# Patient Record
Sex: Female | Born: 1965 | Race: Black or African American | Hispanic: No | Marital: Single | Smoking: Never smoker
Health system: Southern US, Community
[De-identification: ages and names within clinical notes are randomized; demographics above are authoritative.]

## PROBLEM LIST (undated history)

## (undated) DIAGNOSIS — R519 Headache, unspecified: Secondary | ICD-10-CM

## (undated) DIAGNOSIS — E785 Hyperlipidemia, unspecified: Secondary | ICD-10-CM

## (undated) DIAGNOSIS — K219 Gastro-esophageal reflux disease without esophagitis: Secondary | ICD-10-CM

## (undated) DIAGNOSIS — G709 Myoneural disorder, unspecified: Secondary | ICD-10-CM

## (undated) DIAGNOSIS — I10 Essential (primary) hypertension: Secondary | ICD-10-CM

## (undated) DIAGNOSIS — D259 Leiomyoma of uterus, unspecified: Secondary | ICD-10-CM

## (undated) DIAGNOSIS — J3089 Other allergic rhinitis: Secondary | ICD-10-CM

## (undated) DIAGNOSIS — U071 COVID-19: Secondary | ICD-10-CM

## (undated) HISTORY — PX: LEG SURGERY: SHX1003

## (undated) HISTORY — DX: Headache, unspecified: R51.9

## (undated) HISTORY — PX: BREAST CYST ASPIRATION: SHX578

---
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
## 2009-10-07 ENCOUNTER — Emergency Department (HOSPITAL_COMMUNITY): Admission: EM | Admit: 2009-10-07 | Discharge: 2009-10-07 | Payer: Self-pay | Admitting: Family Medicine

## 2012-03-27 ENCOUNTER — Encounter (HOSPITAL_COMMUNITY): Payer: Self-pay | Admitting: *Deleted

## 2012-03-27 ENCOUNTER — Emergency Department (INDEPENDENT_AMBULATORY_CARE_PROVIDER_SITE_OTHER)
Admission: EM | Admit: 2012-03-27 | Discharge: 2012-03-27 | Disposition: A | Discharge status: Home / Self Care | Payer: Managed Care, Other (non HMO) | Source: Home / Self Care | Attending: Family Medicine | Admitting: Family Medicine

## 2012-03-27 DIAGNOSIS — A084 Viral intestinal infection, unspecified: Secondary | ICD-10-CM

## 2012-03-27 DIAGNOSIS — A09 Infectious gastroenteritis and colitis, unspecified: Secondary | ICD-10-CM

## 2012-03-27 HISTORY — DX: Essential (primary) hypertension: I10

## 2012-03-27 NOTE — ED Provider Notes (Signed)
History     CSN: [id number]  Arrival date & time 03/27/12  2595   First MD Initiated Contact with Patient 03/27/12 1017      Chief Complaint  Patient presents with  . Vomiting  . Diarrhea    (Consider location/radiation/quality/duration/timing/severity/associated sxs/prior treatment) Patient is a 46 y.o. female presenting with vomiting. The history is provided by the patient.  Emesis  This is a new problem. The current episode started more than 2 days ago (protracted vomiting on tues, resolved then had diarrhea on wed, resolved and relapsed last eve.). The problem has been gradually improving. There has been no fever. Associated symptoms include diarrhea. Pertinent negatives include no fever.    Past Medical History  Diagnosis Date  . Hypertension     History reviewed. No pertinent past surgical history.  No family history on file.  History  Substance Use Topics  . Smoking status: Never Smoker   . Smokeless tobacco: Not on file  . Alcohol Use: No    OB History    Grav Para Term Preterm Abortions TAB SAB Ect Mult Living                  Review of Systems  Constitutional: Negative.  Negative for fever.  Gastrointestinal: Positive for vomiting and diarrhea. Negative for blood in stool.  Genitourinary: Negative.     Allergies  Penicillins  Home Medications   Current Outpatient Rx  Name Route Sig Dispense Refill  . ASPIRIN 81 MG PO TABS Oral Take 81 mg by mouth daily.    Marland Kitchen LISINOPRIL-HYDROCHLOROTHIAZIDE 20-12.5 MG PO TABS Oral Take 1 tablet by mouth daily.    Marland Kitchen ONE-DAILY MULTI VITAMINS PO TABS Oral Take 1 tablet by mouth daily.      BP 116/77  Pulse 83  Temp(Src) 98.4 F (36.9 C) (Oral)  Resp 14  SpO2 99%  LMP 03/24/2012  Physical Exam  Nursing note and vitals reviewed. Constitutional: She is oriented to person, place, and time. She appears well-developed and well-nourished.  HENT:  Head: Normocephalic.  Mouth/Throat: Oropharynx is clear and moist.   Cardiovascular: Normal rate and regular rhythm.   Pulmonary/Chest: Breath sounds normal.  Abdominal: Soft. Bowel sounds are normal. She exhibits no distension and no mass. There is no tenderness. There is no rebound and no guarding.  Neurological: She is alert and oriented to person, place, and time.  Skin: Skin is warm and dry.  Psychiatric: She has a normal mood and affect.    ED Course  Procedures (including critical care time)  Labs Reviewed - No data to display No results found.   1. Gastroenteritis and colitis, viral       MDM          Linna Hoff, MD 03/30/12 [phone]

## 2012-03-27 NOTE — ED Notes (Signed)
Pt states she started with vomiting and diarrhea on Tuesday.  Stayed home from work on Wednesday and just did liquids and took otc med for diarrhea.  States she felt better yesterday and went to work.  Last night the diarrhea started again and she feels weak.  Color good.

## 2012-03-27 NOTE — Discharge Instructions (Signed)
Clear liquid , bland diet today as tolerated, advance on sat as improved, use imodium as needed for diarrhea, return or see your doctor if any problems.

## 2014-03-03 ENCOUNTER — Encounter: Payer: Managed Care, Other (non HMO) | Attending: Family Medicine | Admitting: Dietician

## 2014-03-03 ENCOUNTER — Encounter: Payer: Self-pay | Admitting: Dietician

## 2014-03-03 VITALS — Ht 65.75 in | Wt 246.9 lb

## 2014-03-03 DIAGNOSIS — E663 Overweight: Secondary | ICD-10-CM | POA: Insufficient documentation

## 2014-03-03 DIAGNOSIS — IMO0002 Reserved for concepts with insufficient information to code with codable children: Secondary | ICD-10-CM | POA: Insufficient documentation

## 2014-03-03 NOTE — Progress Notes (Signed)
Medical Nutrition Therapy:  Appt start time: 2202 end time:  1630.  Assessment:  Primary concerns today: Overweight, HTN, fam Hx of heart disease. Pt reports small wt loss since January of about 2.5 lbs.   Preferred Learning Style:   No preference indicated   Learning Readiness:   Ready  MEDICATIONS: see list.    DIETARY INTAKE: Usual eating pattern includes 3 meals and 1-2 snacks per day. Everyday foods include fruits and cheese, chix, steamed veg.  Avoided foods include whole wheat products, milk.    24-hr recall:  B ( AM): protein shake sometimes. May have a scrambled egg and whole wheat toast with [place] sausage links. Cereal (honey nut protein) with soy milk (chocolate flavor). Fast food once per week (oatmeal or sausage and cheese biscuit). Has been having reflux lately in response in whole wheat products and gluten containing oatmeal . Possible gluten intolerance. No juice- reflux. Water.  Snk ( AM): apple, orange, five cheese cubes, five strawberries.   L ( PM): usually baked chicken and steamed vegetables, or pasta dish. Water.  Snk ( PM): none D ( PM): similar to lunch Snk ( PM): none. Sometimes a PB sandwich.  Beverages: water most of the day. Sometimes a sprite or dr. Malachi Bonds when she has reflux.   Usual physical activity: once per week one hour on treadmill walking briskly. Has enjoyed Zumba classes in the past. Enjoys dancing. No active hobbies noted except hitting balls at the driving range.    Progress Towards Goal(s):  In progress.   Nutritional Diagnosis:  Tyro-3.3 Overweight/obesity As related to low physical activity level, slight kcal excess.  As evidenced by pt diet recall, pt physical activity recall.    Intervention:  Nutrition counseling on choosing leaner proteins, limiting portion sizes for staches, including more nonstarchy veg, gluten free starch options. Exercise Rx provided 4 days per week at 30 minutes per day and 1 day per week at 60 minutes. Progress  by 5 min/day q 2 weeks until achieved 60 minutes per day 5 days per week. Exercise options may include treadmill, calisthenics, circuit training, driving range, Zumba. Pt will include 7 minute cool down after each session (stretching guide provided to pt). Nutrition Rx: 1600 kcal diet with minimum 100 g protein per day. The Plate Method as reviewed as a way to control kcal and portion size.   RD also provided reviewed and approved supplement list, with recommendation of including protein shake if no time to eat or pack a meal, or to enhance protein intake if having difficulty. RD also recommended fish oil at 3000 mg omega 3 fatty acids per day for BP control.   At next appt, possibly provide more in-depth gluten free education to address possible gluten intolerance.  Teaching Method Utilized:  Visual Auditory  Handouts given during visit include:  Best Protein, Fat, and CHO rich foods  Home Workout  Recommended Supplements  Barriers to learning/adherence to lifestyle change: minimal hx with exercise, few exercises noted as enjoyable.  Demonstrated degree of understanding via:  Teach Back   Monitoring/Evaluation:  Dietary intake, exercise, portion control, and body weight in 2 month(s).

## 2014-05-05 ENCOUNTER — Ambulatory Visit: Payer: Managed Care, Other (non HMO) | Admitting: Dietician

## 2015-03-07 ENCOUNTER — Encounter: Payer: Self-pay | Admitting: Cardiology

## 2015-03-28 ENCOUNTER — Encounter: Payer: Managed Care, Other (non HMO) | Admitting: Cardiology

## 2015-04-05 ENCOUNTER — Ambulatory Visit (HOSPITAL_COMMUNITY)
Admission: RE | Admit: 2015-04-05 | Discharge: 2015-04-05 | Disposition: A | Discharge status: Home / Self Care | Payer: Managed Care, Other (non HMO) | Source: Ambulatory Visit | Attending: Cardiovascular Disease | Admitting: Cardiovascular Disease

## 2015-04-05 ENCOUNTER — Other Ambulatory Visit: Payer: Self-pay | Admitting: *Deleted

## 2015-04-05 ENCOUNTER — Telehealth: Payer: Self-pay | Admitting: Family Medicine

## 2015-04-05 DIAGNOSIS — R079 Chest pain, unspecified: Secondary | ICD-10-CM | POA: Insufficient documentation

## 2015-04-05 NOTE — Procedures (Signed)
Exercise Treadmill Tes  Test  Exercise Tolerance Test Ordering MD: Mayra Neer, MD    Unique Test No: 1  Treadmill:  1  Indication for ETT: chest pain - rule out ischemia  Contraindication to ETT: No   Stress Modality: exercise - treadmill  Cardiac Imaging Performed: non   Protocol: standard Bruce - maximal  Max BP:  160/65  Max MPHR (bpm):  173 85% MPR (bpm):  147  MPHR obtained (bpm):  169 % MPHR obtained:  98  Reached 85% MPHR (min:sec):  5:30 Total Exercise Time (min-sec):  8  Workload in METS:  10.1 Borg Scale: 16  Reason ETT Terminated:  Fatigue and SOB    ST Segment Analysis At Rest: normal ST segments - no evidence of significant ST depression With Exercise: no evidence of significant ST depression  Other Information Arrhythmia:  No Angina during ETT:  absent (0) Quality of ETT:  diagnostic  ETT Interpretation:  normal - no evidence of ischemia by ST analysis  Comments: The patient had an reasonable exercise tolerance.  There was no chest pain.  There was an appropriate level of dyspnea.  There were no arrhythmias, a normal heart rate response..  There were no ischemic ST T wave changes and a normal heart rate recovery.  Of note there was difficulty recording the blood pressure peak exercise. There was not clear trend downward with exercise and I would not interpret this as an abnormal blood pressure response.  Recommendations: Negative adequate ETT.

## 2015-04-05 NOTE — Progress Notes (Signed)
Treadmill test ordered by Dr Serita Grammes.

## 2015-04-14 ENCOUNTER — Encounter: Payer: Self-pay | Admitting: *Deleted

## 2015-08-02 NOTE — Telephone Encounter (Signed)
Erroneous

## 2015-10-17 ENCOUNTER — Ambulatory Visit
Admission: RE | Admit: 2015-10-17 | Discharge: 2015-10-17 | Disposition: A | Discharge status: Home / Self Care | Payer: Managed Care, Other (non HMO) | Source: Ambulatory Visit | Attending: Physician Assistant | Admitting: Physician Assistant

## 2015-10-17 ENCOUNTER — Other Ambulatory Visit: Payer: Self-pay | Admitting: Physician Assistant

## 2015-10-17 DIAGNOSIS — R52 Pain, unspecified: Secondary | ICD-10-CM

## 2016-08-04 ENCOUNTER — Encounter (HOSPITAL_COMMUNITY): Payer: Self-pay | Admitting: Emergency Medicine

## 2016-08-04 ENCOUNTER — Ambulatory Visit (HOSPITAL_COMMUNITY)
Admission: EM | Admit: 2016-08-04 | Discharge: 2016-08-04 | Disposition: A | Discharge status: Home / Self Care | Payer: Managed Care, Other (non HMO) | Attending: Emergency Medicine | Admitting: Emergency Medicine

## 2016-08-04 DIAGNOSIS — D259 Leiomyoma of uterus, unspecified: Secondary | ICD-10-CM | POA: Diagnosis not present

## 2016-08-04 DIAGNOSIS — R1031 Right lower quadrant pain: Secondary | ICD-10-CM

## 2016-08-04 LAB — POCT URINALYSIS DIP (DEVICE)
BILIRUBIN URINE: NEGATIVE
GLUCOSE, UA: NEGATIVE mg/dL
KETONES UR: NEGATIVE mg/dL
LEUKOCYTES UA: NEGATIVE
Nitrite: NEGATIVE
PROTEIN: NEGATIVE mg/dL
SPECIFIC GRAVITY, URINE: 1.015 (ref 1.005–1.030)
Urobilinogen, UA: 0.2 mg/dL (ref 0.0–1.0)
pH: 7.5 (ref 5.0–8.0)

## 2016-08-04 MED ORDER — NAPROXEN SODIUM 550 MG PO TABS: 550.0000 mg | ORAL_TABLET | Freq: Two times a day (BID) | ORAL | 1 refills | Status: DC

## 2016-08-04 NOTE — ED Provider Notes (Signed)
CSN: [id number]     Arrival date & time 08/04/16  1816 History   First MD Initiated Contact with Patient 08/04/16 1903     Chief Complaint  Patient presents with  . Abdominal Pain   (Consider location/radiation/quality/duration/timing/severity/associated sxs/prior Treatment) HPI 50 y/o onset of RLQ pain today. Was able to go to church. No pain while sitting . More pain if moving, or walking.  Past Medical History:  Diagnosis Date  . Hypertension    History reviewed. No pertinent surgical history. Family History  Problem Relation Age of Onset  . Hypertension Mother   . Heart disease Brother   . Heart disease Brother   . Heart disease Brother   . Heart disease Brother    Social History  Substance Use Topics  . Smoking status: Never Smoker  . Smokeless tobacco: Never Used  . Alcohol use No   OB History    No data available     Review of Systems  Denies: HEADACHE, NAUSEA, ABDOMINAL PAIN, CHEST PAIN, CONGESTION, DYSURIA, SHORTNESS OF BREATH  Allergies  Penicillins  Home Medications   Prior to Admission medications   Medication Sig Start Date End Date Taking? Authorizing Provider  aspirin 81 MG tablet Take 81 mg by mouth daily.   Yes Historical Provider, MD  lisinopril-hydrochlorothiazide (PRINZIDE,ZESTORETIC) 20-12.5 MG per tablet Take 1 tablet by mouth daily.   Yes Historical Provider, MD  Multiple Vitamin (MULTIVITAMIN) tablet Take 1 tablet by mouth daily.   Yes Historical Provider, MD  ranitidine (ZANTAC) 75 MG tablet Take 75 mg by mouth as needed for heartburn.   Yes Historical Provider, MD  naproxen sodium (ANAPROX DS) 550 MG tablet Take 1 tablet (550 mg total) by mouth 2 (two) times daily with a meal. 08/04/16   [name], PA   Meds Ordered and Administered this Visit  Medications - No data to display  BP (!) 170/107 (BP Location: Left Wrist)   Pulse 79   Temp 98.8 F (37.1 C) (Oral)   Resp 20   LMP 07/27/2016 (Exact Date)   SpO2 100%  No data  found.   Physical Exam NURSES NOTES AND VITAL SIGNS REVIEWED. CONSTITUTIONAL: Well developed, well nourished, no acute distress HEENT: normocephalic, atraumatic EYES: Conjunctiva normal NECK:normal ROM, supple, no adenopathy PULMONARY:No respiratory distress, normal effort ABDOMINAL: Soft, ND, NT BS+, No CVAT, RLQ not tender. There is a firm mass palpable in the right lower pelvis. (pt with known fibroids).  MUSCULOSKELETAL: Normal ROM of all extremities,  SKIN: warm and dry without rash PSYCHIATRIC: Mood and affect, behavior are normal  Urgent Care Course   Clinical Course    Procedures (including critical care time)  Labs Review Labs Reviewed  POCT URINALYSIS DIP (DEVICE) - Abnormal; Notable for the following:       Result Value   Hgb urine dipstick TRACE (*)    All other components within normal limits    Imaging Review No results found.   Visual Acuity Review  Right Eye Distance:   Left Eye Distance:   Bilateral Distance:    Right Eye Near:   Left Eye Near:    Bilateral Near:        Anaprox ds Refer to gyn and pcp MDM   1. Right lower quadrant abdominal pain   2. Uterine leiomyoma, unspecified location     Patient is reassured that there are no issues that require transfer to higher level of care at this time or additional tests. Patient is advised to  continue home symptomatic treatment. Patient is advised that if there are new or worsening symptoms to attend the emergency department, contact primary care provider, or return to UC. Instructions of care provided discharged home in stable condition.    THIS NOTE WAS GENERATED USING A VOICE RECOGNITION SOFTWARE PROGRAM. ALL REASONABLE EFFORTS  WERE MADE TO PROOFREAD THIS DOCUMENT FOR ACCURACY.  I have verbally reviewed the discharge instructions with the patient. A printed AVS was given to the patient.  All questions were answered prior to discharge.      [name], PA 08/04/16 2110

## 2016-08-04 NOTE — ED Triage Notes (Signed)
The patient presented to the [facility] with a complaint of an acute onset of abdominal pain that started in her RLQ and radiates through her flank to her back. She stated that the pain started today.

## 2017-09-22 ENCOUNTER — Other Ambulatory Visit: Payer: Self-pay | Admitting: Physician Assistant

## 2017-09-22 DIAGNOSIS — Z1239 Encounter for other screening for malignant neoplasm of breast: Secondary | ICD-10-CM

## 2017-11-12 ENCOUNTER — Ambulatory Visit
Admission: RE | Admit: 2017-11-12 | Discharge: 2017-11-12 | Disposition: A | Discharge status: Home / Self Care | Payer: Managed Care, Other (non HMO) | Source: Ambulatory Visit | Attending: Physician Assistant | Admitting: Physician Assistant

## 2017-11-12 DIAGNOSIS — Z1239 Encounter for other screening for malignant neoplasm of breast: Secondary | ICD-10-CM

## 2017-11-18 ENCOUNTER — Other Ambulatory Visit: Payer: Self-pay | Admitting: Physician Assistant

## 2017-11-18 DIAGNOSIS — R928 Other abnormal and inconclusive findings on diagnostic imaging of breast: Secondary | ICD-10-CM

## 2017-11-26 ENCOUNTER — Ambulatory Visit
Admission: RE | Admit: 2017-11-26 | Discharge: 2017-11-26 | Disposition: A | Discharge status: Home / Self Care | Payer: Managed Care, Other (non HMO) | Source: Ambulatory Visit | Attending: Physician Assistant | Admitting: Physician Assistant

## 2017-11-26 ENCOUNTER — Other Ambulatory Visit: Payer: Managed Care, Other (non HMO)

## 2017-11-26 DIAGNOSIS — R928 Other abnormal and inconclusive findings on diagnostic imaging of breast: Secondary | ICD-10-CM

## 2017-12-30 HISTORY — PX: COLONOSCOPY: SHX174

## 2018-10-27 ENCOUNTER — Other Ambulatory Visit: Payer: Self-pay | Admitting: Physician Assistant

## 2018-10-27 DIAGNOSIS — Z1231 Encounter for screening mammogram for malignant neoplasm of breast: Secondary | ICD-10-CM

## 2018-12-11 ENCOUNTER — Ambulatory Visit
Admission: RE | Admit: 2018-12-11 | Discharge: 2018-12-11 | Disposition: A | Discharge status: Home / Self Care | Payer: Managed Care, Other (non HMO) | Source: Ambulatory Visit | Attending: Physician Assistant | Admitting: Physician Assistant

## 2018-12-11 DIAGNOSIS — Z1231 Encounter for screening mammogram for malignant neoplasm of breast: Secondary | ICD-10-CM

## 2019-02-11 ENCOUNTER — Other Ambulatory Visit (HOSPITAL_COMMUNITY)
Admission: RE | Admit: 2019-02-11 | Discharge: 2019-02-11 | Disposition: A | Discharge status: Home / Self Care | Payer: Managed Care, Other (non HMO) | Source: Ambulatory Visit | Attending: Physician Assistant | Admitting: Physician Assistant

## 2019-02-11 ENCOUNTER — Other Ambulatory Visit: Payer: Self-pay | Admitting: Physician Assistant

## 2019-02-11 DIAGNOSIS — Z01419 Encounter for gynecological examination (general) (routine) without abnormal findings: Secondary | ICD-10-CM | POA: Diagnosis not present

## 2019-02-12 LAB — CYTOLOGY - PAP: Diagnosis: NEGATIVE

## 2019-08-24 ENCOUNTER — Other Ambulatory Visit: Payer: Self-pay | Admitting: Physician Assistant

## 2019-08-24 DIAGNOSIS — Z1231 Encounter for screening mammogram for malignant neoplasm of breast: Secondary | ICD-10-CM

## 2019-12-13 ENCOUNTER — Ambulatory Visit
Admission: RE | Admit: 2019-12-13 | Discharge: 2019-12-13 | Disposition: A | Discharge status: Home / Self Care | Payer: Managed Care, Other (non HMO) | Source: Ambulatory Visit | Attending: Physician Assistant | Admitting: Physician Assistant

## 2019-12-13 ENCOUNTER — Other Ambulatory Visit: Payer: Self-pay

## 2019-12-13 DIAGNOSIS — Z1231 Encounter for screening mammogram for malignant neoplasm of breast: Secondary | ICD-10-CM

## 2020-08-25 ENCOUNTER — Other Ambulatory Visit: Payer: Self-pay | Admitting: Physician Assistant

## 2020-08-25 DIAGNOSIS — Z1231 Encounter for screening mammogram for malignant neoplasm of breast: Secondary | ICD-10-CM

## 2020-10-28 ENCOUNTER — Ambulatory Visit: Payer: Managed Care, Other (non HMO) | Attending: Internal Medicine

## 2020-10-28 DIAGNOSIS — Z23 Encounter for immunization: Secondary | ICD-10-CM

## 2020-10-28 NOTE — Progress Notes (Signed)
   Covid-19 Vaccination Clinic  Name:  Erin Ross    MRN: [id number] DOB: [birth year]  10/28/2020  [name] was observed post Covid-19 immunization for 15 minutes without incident. She was provided with Vaccine Information Sheet and instruction to access the V-Safe system.   [name] was instructed to call 911 with any severe reactions post vaccine: Marland Kitchen Difficulty breathing  . Swelling of face and throat  . A fast heartbeat  . A bad rash all over body  . Dizziness and weakness

## 2020-12-13 ENCOUNTER — Ambulatory Visit
Admission: RE | Admit: 2020-12-13 | Discharge: 2020-12-13 | Disposition: A | Discharge status: Home / Self Care | Payer: Managed Care, Other (non HMO) | Source: Ambulatory Visit | Attending: Physician Assistant | Admitting: Physician Assistant

## 2020-12-13 ENCOUNTER — Other Ambulatory Visit: Payer: Self-pay

## 2020-12-13 ENCOUNTER — Ambulatory Visit: Payer: Managed Care, Other (non HMO)

## 2020-12-13 DIAGNOSIS — Z1231 Encounter for screening mammogram for malignant neoplasm of breast: Secondary | ICD-10-CM

## 2021-06-04 ENCOUNTER — Other Ambulatory Visit: Payer: Self-pay | Admitting: Physician Assistant

## 2021-06-04 DIAGNOSIS — Z1231 Encounter for screening mammogram for malignant neoplasm of breast: Secondary | ICD-10-CM

## 2021-11-26 ENCOUNTER — Other Ambulatory Visit: Payer: Self-pay | Admitting: Physician Assistant

## 2021-11-26 DIAGNOSIS — R102 Pelvic and perineal pain: Secondary | ICD-10-CM

## 2021-12-05 ENCOUNTER — Ambulatory Visit
Admission: RE | Admit: 2021-12-05 | Discharge: 2021-12-05 | Disposition: A | Discharge status: Home / Self Care | Payer: Managed Care, Other (non HMO) | Source: Ambulatory Visit | Attending: Physician Assistant | Admitting: Physician Assistant

## 2021-12-05 DIAGNOSIS — R102 Pelvic and perineal pain: Secondary | ICD-10-CM

## 2021-12-14 ENCOUNTER — Ambulatory Visit
Admission: RE | Admit: 2021-12-14 | Discharge: 2021-12-14 | Disposition: A | Discharge status: Home / Self Care | Payer: Managed Care, Other (non HMO) | Source: Ambulatory Visit | Attending: Physician Assistant | Admitting: Physician Assistant

## 2021-12-14 DIAGNOSIS — Z1231 Encounter for screening mammogram for malignant neoplasm of breast: Secondary | ICD-10-CM

## 2021-12-30 DIAGNOSIS — Z87898 Personal history of other specified conditions: Secondary | ICD-10-CM

## 2021-12-30 HISTORY — DX: Personal history of other specified conditions: Z87.898

## 2022-01-30 DIAGNOSIS — R7303 Prediabetes: Secondary | ICD-10-CM

## 2022-01-30 HISTORY — DX: Prediabetes: R73.03

## 2022-04-26 ENCOUNTER — Other Ambulatory Visit (HOSPITAL_COMMUNITY): Admission: RE | Admit: 2022-04-26 | Payer: Managed Care, Other (non HMO) | Source: Ambulatory Visit

## 2022-04-26 ENCOUNTER — Other Ambulatory Visit: Payer: Self-pay | Admitting: Nurse Practitioner

## 2022-04-26 ENCOUNTER — Other Ambulatory Visit: Payer: Self-pay

## 2022-04-26 DIAGNOSIS — D219 Benign neoplasm of connective and other soft tissue, unspecified: Secondary | ICD-10-CM

## 2022-04-26 DIAGNOSIS — N950 Postmenopausal bleeding: Secondary | ICD-10-CM

## 2022-04-26 HISTORY — DX: Benign neoplasm of connective and other soft tissue, unspecified: D21.9

## 2022-04-26 HISTORY — DX: Postmenopausal bleeding: N95.0

## 2022-05-01 LAB — CYTOLOGY - PAP
Comment: NEGATIVE
Diagnosis: NEGATIVE
High risk HPV: NEGATIVE

## 2022-06-27 ENCOUNTER — Other Ambulatory Visit: Payer: Self-pay | Admitting: Physician Assistant

## 2022-06-27 DIAGNOSIS — Z1231 Encounter for screening mammogram for malignant neoplasm of breast: Secondary | ICD-10-CM

## 2022-07-30 DIAGNOSIS — U071 COVID-19: Secondary | ICD-10-CM

## 2022-07-30 HISTORY — DX: COVID-19: U07.1

## 2022-08-06 NOTE — Progress Notes (Signed)
Called pt, pt reports she had covid last week and spoke with [place] office surgery will need to be rescheduled.

## 2022-08-07 DIAGNOSIS — N840 Polyp of corpus uteri: Secondary | ICD-10-CM

## 2022-08-07 DIAGNOSIS — N950 Postmenopausal bleeding: Secondary | ICD-10-CM | POA: Insufficient documentation

## 2022-08-07 HISTORY — DX: Polyp of corpus uteri: N84.0

## 2022-08-07 NOTE — H&P (Signed)
Erin Ross is an 56 y.o. postmenopausal G0 who is admitted for Hysteroscopy D&C for postmenopausal bleeding and endometrial polyp detected on endometrial biopsy.   Patient's original procedure was scheduled in August 2023 but she had a COVID infection prior to her surgery and it was rescheduled.  Work-up: Pap smear (04/26/2022): NILM/HRHPV negative  EMB (04/26/2022): Benign endometrial polyp, additional fragments of benign endometrial surface epithelium, negative for hyperplasia and malignancy  TVUS (12/05/2021): EXAM: TRANSABDOMINAL AND TRANSVAGINAL ULTRASOUND OF PELVIS   TECHNIQUE: Both transabdominal and transvaginal ultrasound examinations of the pelvis were performed. Transabdominal technique was performed for global imaging of the pelvis including uterus, ovaries, adnexal regions, and pelvic cul-de-sac. It was necessary to proceed with endovaginal exam following the transabdominal exam to visualize the uterus endometrium adnexa.   COMPARISON:  None   FINDINGS: Uterus   Measurements: 9.5 x 6.1 x 6.6 cm = volume: 202.8 mL. Lobulated contour with multiple fibroids. Posterior uterine corpus exophytic fibroid measuring 2.9 x 3.7 x 3.7 cm. Partially calcified fundal fibroid measuring 3.7 by 3.4 x 3.9 cm. Submucosal calcified fibroid in the uterine corpus measuring 2.2 x 1.9 x 2.2 cm.   Endometrium   Thickness: 5.5 mm.  No focal abnormality visualized.   Right ovary   Not visualized   Left ovary   Measurements: 3.8 x 2.3 x 1.9 cm = volume: 8.5 mL. Normal appearance/no adnexal mass.   Other findings   No abnormal free fluid.   IMPRESSION: 1. Slightly enlarged uterus with multiple uterine fibroids. 2. Nonvisualized right ovary.     Electronically Signed   By: Donavan Foil M.D.   On: 12/05/2021 16:44    Patient Active Problem List   Diagnosis Date Noted   Overweight or obesity 03/03/2014    MEDICAL/FAMILY/SOCIAL HX: Patient's last menstrual  period was 09/17/2017.    Past Medical History:  Diagnosis Date   Hypertension     Past Surgical History:  Procedure Laterality Date   BREAST CYST ASPIRATION      Family History  Problem Relation Age of Onset   Hypertension Mother    Heart disease Brother    Heart disease Brother    Heart disease Brother    Heart disease Brother     Social History:  reports that she has never smoked. She has never used smokeless tobacco. She reports that she does not drink alcohol and does not use drugs.  ALLERGIES/MEDS:  Allergies:  Allergies  Allergen Reactions   Penicillins     No medications prior to admission.     Review of Systems  Constitutional: Negative.   HENT: Negative.    Eyes: Negative.   Respiratory: Negative.    Cardiovascular: Negative.   Gastrointestinal: Negative.   Genitourinary: Negative.   Musculoskeletal: Negative.   Skin: Negative.   Neurological: Negative.   Endo/Heme/Allergies: Negative.   Psychiatric/Behavioral: Negative.      Last menstrual period 09/17/2017. Gen:  NAD, pleasant and cooperative Cardio:  RRR Pulm:  CTAB, no wheezes/rales/rhonchi Abd:  Soft, non-distended, non-tender throughout, no rebound/guarding Ext:  No bilateral LE edema, no bilateral calf tenderness  No results found for this or any previous visit (from the past 24 hour(s)).  No results found.   ASSESSMENT/PLAN: Erin Ross is a 56 y.o. ostmenopausal G0 who is admitted for Hysteroscopy D&C for postmenopausal bleeding and endometrial polyp detected on endometrial biopsy.   - Admit to [facility] labs (CBC, T&S, COVID screen per protocol) - Diet:  ERAS pathway/per anesthesia -  IVF:  Per anesthesia - VTE Prophylaxis:  SCDs - Antibiotics: None - D/C home same-day  Consents: I discussed with the patient that this surgery is performed to look inside the uterus and remove the uterine lining.  Prior to surgery, the risks and benefits of the surgery, as well  as alternative treatments, have been discussed.  The risks include, but are not limited to bleeding, including the need for a blood transfusion, infection, damage to organs and tissues, including uterine perforation, requiring additional surgery, postoperative pain, short-term and long-term, failure of the procedure to control symptoms, need for hysterectomy to control bleeding, fluid overload, which could create electrolyte abnormalities and the need to stop the procedure before completion, inability to safely complete the procedure, deep vein thrombosis and/or pulmonary embolism, painful intercourse, complications the course of which cannot be predicted or prevented, and death.   Patient was consented for blood products.  The patient is aware that bleeding may result in the need for a blood transfusion which includes risk of transmission of HIV (1:2 million), Hepatitis C (1:2 million), and Hepatitis B (1:200 thousand) and transfusion reaction.  Patient voiced understanding of the above risks as well as understanding of indications for blood transfusion.  [name], DO [phone] (office)

## 2022-08-09 NOTE — Progress Notes (Signed)
Spoke with Ms. Erin Ross today, she states she has a sinus infection and on antibiotics now.   Spoke with Dr. Suzette Battiest, anesthesia for today and updated him that pt now has sinus infection, surgery is Tuesday August 15 and tested positive for covid on 08/02/2022.  He said to cancel her surgery and reschedule for 2 weeks from today.   Kelly at Dr. Delora Fuel notified that pt needs to be cancelled for surgery on 08/13/2022 per anesthesia and reschedule for 2 weeks from today.   Ms. Bia called and notified surgery is cancelled for Tuesday  august 15 and will be rescheduled for 2 weeks from now and dr. Delora Fuel office will call her with a date.

## 2022-08-09 NOTE — Progress Notes (Signed)
Spoke with Claiborne Billings at Dr Delora Fuel office. Patient tested positive on 08/02/2022. As long as patient is not immunocompromised and is asymptomatic, current date of surgery is acceptable to proceed.

## 2022-08-09 NOTE — Progress Notes (Signed)
Note pt is still on the surgery schedule for 08-13-2022.  Sent inbox message in epic to [place], Myrene, and Dr Delora Fuel to find out about the status of pt's procedure and let them know pt stated she spoke with someone at the office to reschedule since she has covid.  Trying to call main office and OR scheduler phone's but no answer and no voicemail.

## 2022-08-13 ENCOUNTER — Ambulatory Visit (HOSPITAL_BASED_OUTPATIENT_CLINIC_OR_DEPARTMENT_OTHER)
Admission: RE | Admit: 2022-08-13 | Payer: Managed Care, Other (non HMO) | Source: Home / Self Care | Admitting: Obstetrics and Gynecology

## 2022-08-13 DIAGNOSIS — N950 Postmenopausal bleeding: Secondary | ICD-10-CM | POA: Insufficient documentation

## 2022-08-13 DIAGNOSIS — N840 Polyp of corpus uteri: Secondary | ICD-10-CM

## 2022-08-13 SURGERY — DILATATION & CURETTAGE/HYSTEROSCOPY WITH MYOSURE
Anesthesia: Choice

## 2022-09-19 ENCOUNTER — Encounter: Payer: Self-pay | Admitting: Neurology

## 2022-09-19 DIAGNOSIS — R42 Dizziness and giddiness: Secondary | ICD-10-CM | POA: Insufficient documentation

## 2022-10-10 ENCOUNTER — Encounter: Payer: Self-pay | Admitting: *Deleted

## 2022-10-10 NOTE — Progress Notes (Signed)
[facility]  PATIENT: Erin Ross DOB: [birth year]  REFERRING CLINICIAN: Izora Gala, MD HISTORY FROM: self REASON FOR VISIT: facial pain   HISTORICAL  CHIEF COMPLAINT:  Chief Complaint  Patient presents with   New Patient (Initial Visit)    RM 2, alone. Paper referral for TN, hemifacial pain.  Sx started 08/08/22. No longer feeling light-headed (resolved 10/09/22). Pain located in forehead, ear, gum, teeth, describes as throbbing.  Started having headaches 09/30/22. Taking tylenol, ibuprofen. This does help but sx return.     HISTORY OF PRESENT ILLNESS:  The patient presents for evaluation of facial pain which began in August 2023 following a COVID infection. After COVID she developed lightheadedness and facial pain. Pain is described as throbbing in her right jaw which radiates up to her forehead and temple. It typically lasts for 30 minutes at a time. Pain can be triggered by eating or brushing her teeth. She states that talking for a long time can actually make the pain feel better. She denies electrical, shocking, or burning pain. She has also started to develop mild frontal headaches which are worse with bending forward. No photophobia, phonophobia, or nausea.  She takes Tylenol and ibuprofen as needed which helps temporarily but pain always returns.  Her PCP prescribed an antibiotic which did help initially, but then symptoms returned. She also went to PT for vertigo, but did not find this helpful. She has seen a dentist and endodontist without a clear dental cause of her symptoms. Saw ENT in September who noted a normal exam. Recent CTH was unremarkable.   FACIAL PAIN FEATURES  Side: right  Distribution: V1-3 Any pain on side or back of head: yes Character: throbbing, no electrical pains Duration: 30 minutes Pain-free between episodes: yes Triggers: drinking, brushing teeth, touching lip Sensory abnormalities: no Tried Tegretol/Trileptal:  no Prior procedures/outcome: none History of MS, Lyme's disease, facial rash: no History of dental/oral surgery, facial/plastic surgery: no   OTHER MEDICAL CONDITIONS: HTN   REVIEW OF SYSTEMS: Full 14 system review of systems performed and negative with exception of: facial pain, headaches  ALLERGIES: Allergies  Allergen Reactions   Penicillins Hives    HOME MEDICATIONS: Outpatient Medications Prior to Visit  Medication Sig Dispense Refill   acetaminophen (TYLENOL) 500 MG tablet Take 500 mg by mouth every 6 (six) hours as needed (pain.).     aspirin EC 81 MG tablet Take 81 mg by mouth in the morning.     cetirizine (ZYRTEC) 10 MG tablet Take 10 mg by mouth daily as needed for allergies.     diclofenac (CATAFLAM) 50 MG tablet Take 50 mg by mouth daily as needed (pain.).     ibuprofen (ADVIL) 200 MG tablet Take 400 mg by mouth every 8 (eight) hours as needed (pain.).     lisinopril-hydrochlorothiazide (PRINZIDE,ZESTORETIC) 20-12.5 MG per tablet Take 1 tablet by mouth daily.     Multiple Vitamin (MULTIVITAMIN WITH MINERALS) TABS tablet Take 1 tablet by mouth daily with lunch.     Omega-3 Fatty Acids (FISH OIL) 1200 MG CAPS Take by mouth daily with breakfast.     vitamin D3 (CHOLECALCIFEROL) 25 MCG tablet Take 1,000 Units by mouth every other day. In the morning     zinc sulfate 220 (50 Zn) MG capsule Take 220 mg by mouth daily with breakfast.     No facility-administered medications prior to visit.    PAST MEDICAL HISTORY: Past Medical History:  Diagnosis Date   Facial pain  Hypertension     PAST SURGICAL HISTORY: Past Surgical History:  Procedure Laterality Date   BREAST CYST ASPIRATION     LEG SURGERY Right    73 months old- had fluid removed    FAMILY HISTORY: Family History  Problem Relation Age of Onset   Hypertension Mother    High blood pressure Mother    High Cholesterol Mother    High blood pressure Father    Heart disease Brother    Heart disease  Brother    Heart disease Brother    Heart disease Brother     SOCIAL HISTORY: Social History   Socioeconomic History   Marital status: Single    Spouse name: Not on file   Number of children: 0   Years of education: 14   Highest education level: Not on file  Occupational History   Not on file  Tobacco Use   Smoking status: Never   Smokeless tobacco: Never  Substance and Sexual Activity   Alcohol use: No   Drug use: No   Sexual activity: Yes    Birth control/protection: None  Other Topics Concern   Not on file  Social History Narrative   Right handed   Caffeine use: Diet Ginger ale   Social Determinants of Health   Financial Resource Strain: Not on file  Food Insecurity: Not on file  Transportation Needs: Not on file  Physical Activity: Not on file  Stress: Not on file  Social Connections: Not on file  Intimate Partner Violence: Not on file     PHYSICAL EXAM  GENERAL EXAM/CONSTITUTIONAL: Vitals:  Vitals:   10/11/22 0859  BP: (!) 152/89  Pulse: 80  Weight: 260 lb (117.9 kg)  Height: '5\' 7"'$  (1.702 m)   Body mass index is 40.72 kg/m. Wt Readings from Last 3 Encounters:  10/11/22 260 lb (117.9 kg)  03/03/14 246 lb 14.4 oz (112 kg)    NEUROLOGIC: MENTAL STATUS:  awake, alert, oriented to person, place and time recent and remote memory intact normal attention and concentration  CRANIAL NERVE:  2nd, 3rd, 4th, 6th - pupils equal and reactive to light, visual fields full to confrontation, extraocular muscles intact, no nystagmus 5th - facial sensation symmetric 7th - facial strength symmetric 8th - hearing intact 9th - palate elevates symmetrically, uvula midline 11th - shoulder shrug symmetric 12th - tongue protrusion midline  MOTOR:  normal bulk and tone, full strength in the BUE, BLE  SENSORY:  normal and symmetric to light touch all 4 extremities  COORDINATION:  finger-nose-finger intact bilaterally  REFLEXES:  deep tendon reflexes present  and symmetric  GAIT/STATION:  normal     DIAGNOSTIC DATA (LABS, IMAGING, TESTING) - I reviewed patient records, labs, notes, testing and imaging myself where available.  [facility] personally reviewed 10/11/22 with no acute process   ASSESSMENT AND PLAN  55 y.o. year old female with a history of HTN who presents for evaluation of right sided facial pain which began in August 2023 after a COVID infection. 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 Will order MRI brain/trigeminal to assess for structural causes of her pain including vascular compression. Will start gabapentin for pain prevention and baclofen as needed for breakthrough pain.   1. Trigeminal neuralgia       PLAN: -MRI brain/trigeminal -Start gabapentin 300 mg QHS, uptitrate by 300 mg per week up to 300 mg  BID -Start baclofen 5-10 mg TID PRN  Orders Placed This Encounter  Procedures   MR BRAIN W WO CONTRAST   MR FACE/TRIGEMINAL W/CM    Meds ordered this encounter  Medications   LORazepam (ATIVAN) 0.5 MG tablet    Sig: Take 1-2 pills 30 minutes prior to MRI    Dispense:  2 tablet    Refill:  0   gabapentin (NEURONTIN) 300 MG capsule    Sig: Take 1 pill at bedtime for one week, then increase to 1 pill twice a day, then increase to 1 pill 3 times a day    Dispense:  90 capsule    Refill:  6   baclofen (LIORESAL) 10 MG tablet    Sig: Take 1/2 pill up to three times a day as needed for facial pain    Dispense:  30 each    Refill:  6    Return in about 3 months (around 01/11/2023).    Genia Harold, MD 10/11/22 9:46 AM  I spent an average of 33 minutes chart reviewing and counseling the patient, with at least 50% of the time face to face with the patient.   [facility] [address], [place], [place] [address] [phone]

## 2022-10-11 ENCOUNTER — Encounter: Payer: Self-pay | Admitting: Psychiatry

## 2022-10-11 ENCOUNTER — Ambulatory Visit: Payer: Managed Care, Other (non HMO) | Admitting: Psychiatry

## 2022-10-11 ENCOUNTER — Telehealth: Payer: Self-pay | Admitting: Psychiatry

## 2022-10-11 VITALS — BP 152/89 | HR 80 | Ht 67.0 in | Wt 260.0 lb

## 2022-10-11 DIAGNOSIS — R519 Headache, unspecified: Secondary | ICD-10-CM | POA: Diagnosis not present

## 2022-10-11 DIAGNOSIS — G500 Trigeminal neuralgia: Secondary | ICD-10-CM

## 2022-10-11 MED ORDER — GABAPENTIN 300 MG PO CAPS: ORAL_CAPSULE | ORAL | 6 refills | Status: DC

## 2022-10-11 MED ORDER — LORAZEPAM 0.5 MG PO TABS: ORAL_TABLET | ORAL | 0 refills | Status: DC

## 2022-10-11 MED ORDER — BACLOFEN 10 MG PO TABS: ORAL_TABLET | ORAL | 6 refills | Status: DC

## 2022-10-11 NOTE — Telephone Encounter (Signed)
Pt states she takes Meclizine for light headness, pt would like to know if this is an issue taking while the medication prescribed by Dr. Billey Gosling. Would like a call from the nurse.

## 2022-10-11 NOTE — Patient Instructions (Signed)
MRI of the brain  Start gabapentin for facial pain. Take one pill at bedtime for one week, then can increase to one pill twice a day for one week, then can increase to one pill three times a day  Start baclofen as needed for facial pain. Take 1/2 pill as needed for facial pain

## 2022-10-14 ENCOUNTER — Encounter (HOSPITAL_COMMUNITY): Payer: Self-pay | Admitting: Obstetrics and Gynecology

## 2022-10-14 NOTE — Telephone Encounter (Signed)
Spoke with patient and advised her per Dr Billey Gosling,  Meclizine is okay to take with the baclofen and gabapentin. Patient verbalized understanding, appreciation.

## 2022-10-14 NOTE — Telephone Encounter (Signed)
Meclizine is okay to take with the baclofen and gabapentin

## 2022-10-15 ENCOUNTER — Other Ambulatory Visit: Payer: Self-pay

## 2022-10-15 ENCOUNTER — Encounter (HOSPITAL_COMMUNITY): Payer: Self-pay | Admitting: Obstetrics and Gynecology

## 2022-10-15 NOTE — Progress Notes (Signed)
PCP -  Lennie Odor, PA-C Cardiologist - n/a Neurology - Dr Genia Harold  Chest x-ray - n/a EKG - DOS Stress Test - 04/06/15 ECHO - n/a Cardiac Cath - n/a  ICD Pacemaker/Loop - n/a  Sleep Study -  Yes, negative test in 2022 CPAP - none  Aspirin Instructions: Last dose of ASA was on 09/28/22.  ERAS: Clear liquids til 7:30 AM DOS.  STOP now taking any Aspirin (unless otherwise instructed by your surgeon), Aleve, Naproxen, Ibuprofen, Motrin, Advil, Goody's, BC's, all herbal medications, fish oil, and all vitamins.   Coronavirus Screening Do you have any of the following symptoms:  Cough yes/no: No Fever (>100.65F)  yes/no: No Runny nose yes/no: No Sore throat yes/no: No Difficulty breathing/shortness of breath  yes/no: No  Have you traveled in the last 14 days and where? yes/no: No  Patient verbalized understanding of instructions that were given via phone.

## 2022-10-15 NOTE — H&P (Incomplete)
Erin Ross is an 56 y.o. postmenopausal G0 who is admitted for Hysteroscopy D&C for postmenopausal bleeding and endometrial polyp detected on endometrial biopsy.   Patient's original procedure was scheduled in August 2023 but she had a COVID infection prior to her surgery and it was rescheduled.  Work-up: Pap smear (04/26/2022): NILM/HRHPV negative  EMB (04/26/2022): Benign endometrial polyp, additional fragments of benign endometrial surface epithelium, negative for hyperplasia and malignancy  TVUS (12/05/2021): EXAM: TRANSABDOMINAL AND TRANSVAGINAL ULTRASOUND OF PELVIS   TECHNIQUE: Both transabdominal and transvaginal ultrasound examinations of the pelvis were performed. Transabdominal technique was performed for global imaging of the pelvis including uterus, ovaries, adnexal regions, and pelvic cul-de-sac. It was necessary to proceed with endovaginal exam following the transabdominal exam to visualize the uterus endometrium adnexa.   COMPARISON:  None   FINDINGS: Uterus   Measurements: 9.5 x 6.1 x 6.6 cm = volume: 202.8 mL. Lobulated contour with multiple fibroids. Posterior uterine corpus exophytic fibroid measuring 2.9 x 3.7 x 3.7 cm. Partially calcified fundal fibroid measuring 3.7 by 3.4 x 3.9 cm. Submucosal calcified fibroid in the uterine corpus measuring 2.2 x 1.9 x 2.2 cm.   Endometrium   Thickness: 5.5 mm.  No focal abnormality visualized.   Right ovary   Not visualized   Left ovary   Measurements: 3.8 x 2.3 x 1.9 cm = volume: 8.5 mL. Normal appearance/no adnexal mass.   Other findings   No abnormal free fluid.   IMPRESSION: 1. Slightly enlarged uterus with multiple uterine fibroids. 2. Nonvisualized right ovary.     Electronically Signed   By: Donavan Foil M.D.   On: 12/05/2021 16:44    Patient Active Problem List   Diagnosis Date Noted   Postmenopausal bleeding 08/07/2022   Overweight or obesity 03/03/2014     MEDICAL/FAMILY/SOCIAL HX: Patient's last menstrual period was 09/17/2017.    Past Medical History:  Diagnosis Date   COVID-19 07/2022   Endometrial polyp 08/07/2022   benign   Facial pain    Fibroids 04/26/2022   benign uterine fibroids   Hypertension    Neuromuscular disorder ([place])    nerve - face hurts   PMB (postmenopausal bleeding) 04/26/2022   Pre-diabetes 01/2022   HgbA1C was 6.7    Past Surgical History:  Procedure Laterality Date   BREAST CYST ASPIRATION     local   LEG SURGERY Right    25 months old- had fluid removed    Family History  Problem Relation Age of Onset   Hypertension Mother    High blood pressure Mother    High Cholesterol Mother    High blood pressure Father    Heart disease Brother    Heart disease Brother    Heart disease Brother    Heart disease Brother     Social History:  reports that she has never smoked. She has never used smokeless tobacco. She reports that she does not drink alcohol and does not use drugs.  ALLERGIES/MEDS:  Allergies:  Allergies  Allergen Reactions   Penicillins Hives    Medications Prior to Admission  Medication Sig Dispense Refill Last Dose   acetaminophen (TYLENOL) 500 MG tablet Take 500 mg by mouth every 6 (six) hours as needed (pain.).   10/15/2022   aspirin EC 81 MG tablet Take 81 mg by mouth in the morning.   Past Month   baclofen (LIORESAL) 10 MG tablet Take 1/2 pill up to three times a day as needed for facial pain 30 each  6 10/15/2022   cetirizine (ZYRTEC) 10 MG tablet Take 10 mg by mouth daily as needed for allergies.   10/15/2022   diclofenac (CATAFLAM) 50 MG tablet Take 50 mg by mouth daily as needed (pain.).   Past Month   gabapentin (NEURONTIN) 300 MG capsule Take 1 pill at bedtime for one week, then increase to 1 pill twice a day, then increase to 1 pill 3 times a day 90 capsule 6 10/15/2022   ibuprofen (ADVIL) 200 MG tablet Take 400 mg by mouth every 8 (eight) hours as needed (pain.).    Past Week   lisinopril-hydrochlorothiazide (PRINZIDE,ZESTORETIC) 20-12.5 MG per tablet Take 1 tablet by mouth daily.   10/15/2022   Multiple Vitamin (MULTIVITAMIN WITH MINERALS) TABS tablet Take 1 tablet by mouth daily with lunch.   Past Week   Omega-3 Fatty Acids (FISH OIL) 1200 MG CAPS Take by mouth daily with breakfast.   Past Week   vitamin D3 (CHOLECALCIFEROL) 25 MCG tablet Take 1,000 Units by mouth every other day. In the morning   Past Week   zinc sulfate 220 (50 Zn) MG capsule Take 220 mg by mouth daily with breakfast.   Past Week   LORazepam (ATIVAN) 0.5 MG tablet Take 1-2 pills 30 minutes prior to MRI 2 tablet 0      Review of Systems  Constitutional: Negative.   HENT: Negative.    Eyes: Negative.   Respiratory: Negative.    Cardiovascular: Negative.   Gastrointestinal: Negative.   Genitourinary: Negative.   Musculoskeletal: Negative.   Skin: Negative.   Neurological: Negative.   Endo/Heme/Allergies: Negative.   Psychiatric/Behavioral: Negative.      Blood pressure (!) 165/90, pulse (!) 105, temperature 98.3 F (36.8 C), temperature source Oral, resp. rate 18, height '5\' 7"'$  (1.702 m), weight 117.9 kg, last menstrual period 09/17/2017. Gen:  NAD, pleasant and cooperative Cardio:  RRR Pulm:  CTAB, no wheezes/rales/rhonchi Abd:  Soft, non-distended, non-tender throughout, no rebound/guarding Ext:  No bilateral LE edema, no bilateral calf tenderness  No results found for this or any previous visit (from the past 24 hour(s)).  No results found.   ASSESSMENT/PLAN: Erin Ross is a 56 y.o. ostmenopausal G0 who is admitted for Hysteroscopy D&C for postmenopausal bleeding and endometrial polyp detected on endometrial biopsy.   - Admit to [facility] - Admit labs (CBC, T&S, COVID screen per protocol) - Diet:  ERAS pathway/per anesthesia - IVF:  Per anesthesia - VTE Prophylaxis:  SCDs - Antibiotics: None - D/C home same-day  Consents: I discussed with the patient  that this surgery is performed to look inside the uterus and remove the uterine lining.  Prior to surgery, the risks and benefits of the surgery, as well as alternative treatments, have been discussed.  The risks include, but are not limited to bleeding, including the need for a blood transfusion, infection, damage to organs and tissues, including uterine perforation, requiring additional surgery, postoperative pain, short-term and long-term, failure of the procedure to control symptoms, need for hysterectomy to control bleeding, fluid overload, which could create electrolyte abnormalities and the need to stop the procedure before completion, inability to safely complete the procedure, deep vein thrombosis and/or pulmonary embolism, painful intercourse, complications the course of which cannot be predicted or prevented, and death.   Patient was consented for blood products.  The patient is aware that bleeding may result in the need for a blood transfusion which includes risk of transmission of HIV (1:2 million), Hepatitis C (1:2 million), and  Hepatitis B (1:200 thousand) and transfusion reaction.  Patient voiced understanding of the above risks as well as understanding of indications for blood transfusion.  [name], DO [phone] (office)

## 2022-10-16 ENCOUNTER — Ambulatory Visit (HOSPITAL_COMMUNITY): Payer: Managed Care, Other (non HMO) | Admitting: Certified Registered Nurse Anesthetist

## 2022-10-16 ENCOUNTER — Ambulatory Visit (HOSPITAL_COMMUNITY)
Admission: RE | Admit: 2022-10-16 | Discharge: 2022-10-16 | Disposition: A | Discharge status: Home / Self Care | Payer: Managed Care, Other (non HMO) | Source: Ambulatory Visit | Attending: Obstetrics and Gynecology | Admitting: Obstetrics and Gynecology

## 2022-10-16 ENCOUNTER — Other Ambulatory Visit: Payer: Self-pay

## 2022-10-16 ENCOUNTER — Encounter (HOSPITAL_COMMUNITY)
Admission: RE | Disposition: A | Discharge status: Home / Self Care | Payer: Self-pay | Source: Ambulatory Visit | Attending: Obstetrics and Gynecology

## 2022-10-16 ENCOUNTER — Encounter (HOSPITAL_COMMUNITY): Payer: Self-pay | Admitting: Obstetrics and Gynecology

## 2022-10-16 ENCOUNTER — Ambulatory Visit (HOSPITAL_BASED_OUTPATIENT_CLINIC_OR_DEPARTMENT_OTHER): Payer: Managed Care, Other (non HMO) | Admitting: Certified Registered Nurse Anesthetist

## 2022-10-16 DIAGNOSIS — N950 Postmenopausal bleeding: Secondary | ICD-10-CM

## 2022-10-16 DIAGNOSIS — N840 Polyp of corpus uteri: Secondary | ICD-10-CM | POA: Diagnosis not present

## 2022-10-16 DIAGNOSIS — Z79899 Other long term (current) drug therapy: Secondary | ICD-10-CM | POA: Insufficient documentation

## 2022-10-16 DIAGNOSIS — Z6841 Body Mass Index (BMI) 40.0 and over, adult: Secondary | ICD-10-CM

## 2022-10-16 DIAGNOSIS — I10 Essential (primary) hypertension: Secondary | ICD-10-CM | POA: Insufficient documentation

## 2022-10-16 DIAGNOSIS — N841 Polyp of cervix uteri: Secondary | ICD-10-CM | POA: Diagnosis not present

## 2022-10-16 HISTORY — DX: COVID-19: U07.1

## 2022-10-16 HISTORY — DX: Myoneural disorder, unspecified: G70.9

## 2022-10-16 HISTORY — PX: DILATATION & CURETTAGE/HYSTEROSCOPY WITH MYOSURE: SHX6511

## 2022-10-16 LAB — BASIC METABOLIC PANEL
Anion gap: 11 (ref 5–15)
BUN: 19 mg/dL (ref 6–20)
CO2: 25 mmol/L (ref 22–32)
Calcium: 9.7 mg/dL (ref 8.9–10.3)
Chloride: 103 mmol/L (ref 98–111)
Creatinine, Ser: 0.86 mg/dL (ref 0.44–1.00)
GFR, Estimated: >60 mL/min (ref >60)
Glucose, Bld: 106 mg/dL — ABNORMAL HIGH (ref 70–99)
Potassium: 3.4 mmol/L — ABNORMAL LOW (ref 3.5–5.1)
Sodium: 139 mmol/L (ref 135–145)

## 2022-10-16 LAB — CBC
HCT: 39.3 % (ref 36.0–46.0)
Hemoglobin: 12.9 g/dL (ref 12.0–15.0)
MCH: 27.9 pg (ref 26.0–34.0)
MCHC: 32.8 g/dL (ref 30.0–36.0)
MCV: 84.9 fL (ref 80.0–100.0)
Platelets: 336 10*3/uL (ref 150–400)
RBC: 4.63 MIL/uL (ref 3.87–5.11)
RDW: 14.3 % (ref 11.5–15.5)
WBC: 5.3 10*3/uL (ref 4.0–10.5)
nRBC: 0 % (ref 0.0–0.2)

## 2022-10-16 LAB — ABO/RH: ABO/RH(D): O POS

## 2022-10-16 LAB — TYPE AND SCREEN
ABO/RH(D): O POS
Antibody Screen: NEGATIVE

## 2022-10-16 SURGERY — DILATATION & CURETTAGE/HYSTEROSCOPY WITH MYOSURE
Anesthesia: General | Site: Uterus

## 2022-10-16 MED ORDER — LACTATED RINGERS IV SOLN: INTRAVENOUS | Status: DC

## 2022-10-16 MED ORDER — DEXAMETHASONE SODIUM PHOSPHATE 10 MG/ML IJ SOLN
INTRAMUSCULAR | Status: AC
  Filled 2022-10-16: qty 1

## 2022-10-16 MED ORDER — SODIUM CHLORIDE 0.9 % IR SOLN
Status: DC | PRN
  Administered 2022-10-16: 3000 mL

## 2022-10-16 MED ORDER — ACETAMINOPHEN 160 MG/5ML PO SOLN: 1000.0000 mg | Freq: Once | ORAL | Status: DC | PRN

## 2022-10-16 MED ORDER — SILVER NITRATE-POT NITRATE 75-25 % EX MISC
CUTANEOUS | Status: DC | PRN
  Administered 2022-10-16 (×3): 1

## 2022-10-16 MED ORDER — LIDOCAINE 2% (20 MG/ML) 5 ML SYRINGE
INTRAMUSCULAR | Status: AC
  Filled 2022-10-16: qty 5

## 2022-10-16 MED ORDER — LIDOCAINE 2% (20 MG/ML) 5 ML SYRINGE
INTRAMUSCULAR | Status: DC | PRN
  Administered 2022-10-16: 100 mg via INTRAVENOUS

## 2022-10-16 MED ORDER — CHLORHEXIDINE GLUCONATE 0.12 % MT SOLN
15.0000 mL | Freq: Once | OROMUCOSAL | Status: AC
  Administered 2022-10-16: 15 mL via OROMUCOSAL
  Filled 2022-10-16: qty 15

## 2022-10-16 MED ORDER — ONDANSETRON HCL 4 MG/2ML IJ SOLN
INTRAMUSCULAR | Status: DC | PRN
  Administered 2022-10-16: 4 mg via INTRAVENOUS

## 2022-10-16 MED ORDER — ACETAMINOPHEN 10 MG/ML IV SOLN
INTRAVENOUS | Status: DC | PRN
  Administered 2022-10-16: 1000 mg via INTRAVENOUS

## 2022-10-16 MED ORDER — ACETAMINOPHEN 10 MG/ML IV SOLN: 1000.0000 mg | Freq: Once | INTRAVENOUS | Status: DC | PRN

## 2022-10-16 MED ORDER — ONDANSETRON HCL 4 MG/2ML IJ SOLN
INTRAMUSCULAR | Status: AC
  Filled 2022-10-16: qty 2

## 2022-10-16 MED ORDER — IBUPROFEN 800 MG PO TABS: 800.0000 mg | ORAL_TABLET | Freq: Three times a day (TID) | ORAL | 0 refills | Status: DC | PRN

## 2022-10-16 MED ORDER — ACETAMINOPHEN 500 MG PO TABS: 1000.0000 mg | ORAL_TABLET | Freq: Once | ORAL | Status: DC | PRN

## 2022-10-16 MED ORDER — KETOROLAC TROMETHAMINE 30 MG/ML IJ SOLN
30.0000 mg | Freq: Once | INTRAMUSCULAR | Status: AC
  Administered 2022-10-16: 30 mg via INTRAVENOUS

## 2022-10-16 MED ORDER — OXYCODONE HCL 5 MG PO TABS: 5.0000 mg | ORAL_TABLET | Freq: Once | ORAL | Status: DC | PRN

## 2022-10-16 MED ORDER — ACETAMINOPHEN 10 MG/ML IV SOLN
INTRAVENOUS | Status: AC
  Filled 2022-10-16: qty 100

## 2022-10-16 MED ORDER — OXYCODONE HCL 5 MG/5ML PO SOLN: 5.0000 mg | Freq: Once | ORAL | Status: DC | PRN

## 2022-10-16 MED ORDER — KETOROLAC TROMETHAMINE 30 MG/ML IJ SOLN
INTRAMUSCULAR | Status: AC
  Filled 2022-10-16: qty 1

## 2022-10-16 MED ORDER — PROPOFOL 10 MG/ML IV BOLUS
INTRAVENOUS | Status: DC | PRN
  Administered 2022-10-16: 100 mg via INTRAVENOUS
  Administered 2022-10-16: 200 mg via INTRAVENOUS

## 2022-10-16 MED ORDER — MIDAZOLAM HCL 2 MG/2ML IJ SOLN
INTRAMUSCULAR | Status: AC
  Filled 2022-10-16: qty 2

## 2022-10-16 MED ORDER — GLYCOPYRROLATE PF 0.2 MG/ML IJ SOSY
PREFILLED_SYRINGE | INTRAMUSCULAR | Status: AC
  Filled 2022-10-16: qty 1

## 2022-10-16 MED ORDER — ORAL CARE MOUTH RINSE: 15.0000 mL | Freq: Once | OROMUCOSAL | Status: AC

## 2022-10-16 MED ORDER — PHENYLEPHRINE 80 MCG/ML (10ML) SYRINGE FOR IV PUSH (FOR BLOOD PRESSURE SUPPORT)
PREFILLED_SYRINGE | INTRAVENOUS | Status: DC | PRN
  Administered 2022-10-16 (×2): 80 ug via INTRAVENOUS
  Administered 2022-10-16 (×2): 160 ug via INTRAVENOUS
  Administered 2022-10-16: 80 ug via INTRAVENOUS
  Administered 2022-10-16 (×4): 160 ug via INTRAVENOUS

## 2022-10-16 MED ORDER — FENTANYL CITRATE (PF) 250 MCG/5ML IJ SOLN
INTRAMUSCULAR | Status: DC | PRN
  Administered 2022-10-16 (×2): 50 ug via INTRAVENOUS

## 2022-10-16 MED ORDER — PROPOFOL 10 MG/ML IV BOLUS
INTRAVENOUS | Status: AC
  Filled 2022-10-16: qty 20

## 2022-10-16 MED ORDER — MIDAZOLAM HCL 2 MG/2ML IJ SOLN
INTRAMUSCULAR | Status: DC | PRN
  Administered 2022-10-16: 2 mg via INTRAVENOUS

## 2022-10-16 MED ORDER — FENTANYL CITRATE (PF) 100 MCG/2ML IJ SOLN
25.0000 ug | INTRAMUSCULAR | Status: DC | PRN
  Administered 2022-10-16: 50 ug via INTRAVENOUS

## 2022-10-16 MED ORDER — ORAL CARE MOUTH RINSE: 15.0000 mL | Freq: Once | OROMUCOSAL | Status: DC

## 2022-10-16 MED ORDER — FENTANYL CITRATE (PF) 250 MCG/5ML IJ SOLN
INTRAMUSCULAR | Status: AC
  Filled 2022-10-16: qty 5

## 2022-10-16 MED ORDER — GLYCOPYRROLATE PF 0.2 MG/ML IJ SOSY
PREFILLED_SYRINGE | INTRAMUSCULAR | Status: DC | PRN
  Administered 2022-10-16: .2 mg via INTRAVENOUS

## 2022-10-16 MED ORDER — CHLORHEXIDINE GLUCONATE 0.12 % MT SOLN: 15.0000 mL | Freq: Once | OROMUCOSAL | Status: DC

## 2022-10-16 MED ORDER — FENTANYL CITRATE (PF) 100 MCG/2ML IJ SOLN
INTRAMUSCULAR | Status: AC
  Filled 2022-10-16: qty 2

## 2022-10-16 MED ORDER — DEXAMETHASONE SODIUM PHOSPHATE 10 MG/ML IJ SOLN
INTRAMUSCULAR | Status: DC | PRN
  Administered 2022-10-16: 10 mg via INTRAVENOUS

## 2022-10-16 MED ORDER — PHENYLEPHRINE 80 MCG/ML (10ML) SYRINGE FOR IV PUSH (FOR BLOOD PRESSURE SUPPORT)
PREFILLED_SYRINGE | INTRAVENOUS | Status: AC
  Filled 2022-10-16: qty 20

## 2022-10-16 SURGICAL SUPPLY — 17 items
CANISTER SUCT 3000ML PPV (MISCELLANEOUS) ×1 IMPLANT
CATH ROBINSON RED A/P 16FR (CATHETERS) ×1 IMPLANT
DEVICE MYOSURE LITE (MISCELLANEOUS) IMPLANT
DEVICE MYOSURE REACH (MISCELLANEOUS) IMPLANT
DILATOR CANAL MILEX (MISCELLANEOUS) IMPLANT
GLOVE BIO SURGEON STRL SZ 6.5 (GLOVE) ×1 IMPLANT
GLOVE SURG ENC TEXT LTX SZ6.5 (GLOVE) ×1 IMPLANT
GLOVE SURG UNDER POLY LF SZ7 (GLOVE) ×1 IMPLANT
GOWN STRL REUS W/ TWL LRG LVL3 (GOWN DISPOSABLE) ×2 IMPLANT
GOWN STRL REUS W/TWL LRG LVL3 (GOWN DISPOSABLE) ×2
KIT PROCEDURE FLUENT (KITS) ×1 IMPLANT
KIT TURNOVER KIT B (KITS) ×1 IMPLANT
PACK VAGINAL MINOR WOMEN LF (CUSTOM PROCEDURE TRAY) ×1 IMPLANT
PAD OB MATERNITY 4.3X12.25 (PERSONAL CARE ITEMS) ×1 IMPLANT
SEAL ROD LENS SCOPE MYOSURE (ABLATOR) ×1 IMPLANT
TOWEL GREEN STERILE FF (TOWEL DISPOSABLE) ×2 IMPLANT
UNDERPAD 30X36 HEAVY ABSORB (UNDERPADS AND DIAPERS) ×1 IMPLANT

## 2022-10-16 NOTE — Op Note (Signed)
Pre Op Dx:   1. Postmenopausal bleeding 2. Endometrial polyp on endometrial biopsy  Post Op Dx:   1. Postmenopausal bleeding 2. Endometrial polyps 3. Cervical polyps  Procedure:   Hysteroscopy with Dilation and Curettage with Myosure Polypectomy   Surgeon:  Dr. [name] Assistants:  None Anesthesia:  General   EBL:  5cc  IVF:  See anesthesia documentation UOP:  25cc via in-and-out catheter Fluid Deficit:  365cc   Drains:  In-and-out foley catheter Specimen removed:  Cervical polyps, endometrial polyps, and endometrial curettings - sent to pathology Device(s) implanted: None Case Type:  Clean-contaminated Findings:  Normal-appearing cervix with a small cervical polyp protruding through the cervical os. Multiple large, broad-based polyps in the cervical canal and endometrial cavity. Bilateral tubal ostia visualized after resection of polyps. Uterus sounded to 10cm. Complications: None Indications:  56 y.o. postmenopausal G0 with PMB and endometrial polyp detected on endometrial biopsy.  Description of each procedure:  After informed consent was obtained the patient was taken to the operating room in the dorsal supine position.  After administration of general anesthesia, the patient was placed in the dorsal lithotomy position and prepped and draped in the usual sterile fashion.  Her bladder was emptied using an in and out catheter.  A pre-operative time-out was completed.  The anterior lip of the cervix was grasped with a single-tooth tenaculum and the cervical polyp was twisted at the stalk and removed using a polyp forcep. The cervix was serially dilated to accommodate the hysteroscope.  The hysteroscope was advanced and the findings as above was noted. The Myosure Reach was used to resect the cervical and endometrial polyps completely. The endometrium was sampled using the Myosure. The single-tooth tenaculum was removed and its sites were made hemostatic with silver nitrate and  pressure.  Adequate hemostasis was noted.  The patient was awakened and extubated and appeared to have tolerated the procedure well.  All counts were correct.  Disposition:   PACU  [name], DO

## 2022-10-16 NOTE — Anesthesia Procedure Notes (Signed)
Procedure Name: LMA Insertion Date/Time: 10/16/2022 10:43 AM  Performed by: Michele Rockers, CRNAPre-anesthesia Checklist: Patient identified, Patient being monitored, Timeout performed, Emergency Drugs available and Suction available Patient Re-evaluated:Patient Re-evaluated prior to induction Oxygen Delivery Method: Circle system utilized Preoxygenation: Pre-oxygenation with 100% oxygen Induction Type: IV induction Ventilation: Mask ventilation without difficulty LMA: LMA with gastric port inserted LMA Size: 4.0 Number of attempts: 1 Placement Confirmation: positive ETCO2 and breath sounds checked- equal and bilateral Tube secured with: Tape Dental Injury: Teeth and Oropharynx as per pre-operative assessment

## 2022-10-16 NOTE — Transfer of Care (Signed)
Immediate Anesthesia Transfer of Care Note  Patient: Erin Ross  Procedure(s) Performed: DILATATION & CURETTAGE/HYSTEROSCOPY WITH MYOSURE (Uterus)  Patient Location: PACU  Anesthesia Type:General  Level of Consciousness: sedated  Airway & Oxygen Therapy: Patient Spontanous Breathing and Patient connected to nasal cannula oxygen  Post-op Assessment: Report given to RN and Post -op Vital signs reviewed and stable  Post vital signs: Reviewed and stable  Last Vitals:  Vitals Value Taken Time  BP 103/67 10/16/22 1133  Temp 36.4 C 10/16/22 1133  Pulse 101 10/16/22 1136  Resp 20 10/16/22 1136  SpO2 97 % 10/16/22 1136  Vitals shown include unvalidated device data.  Last Pain:  Vitals:   10/16/22 0837  TempSrc:   PainSc: 0-No pain         Complications: No notable events documented.

## 2022-10-16 NOTE — Interval H&P Note (Signed)
History and Physical Interval Note:  10/16/2022 8:39 AM  Erin Ross  has presented today for surgery, with the diagnosis of Postmenopausal Bleeding and endometrial polyp.  The various methods of treatment have been discussed with the patient and family. After consideration of risks, benefits and other options for treatment, the patient has consented to  Procedure(s) with comments: [place] (N/A) - rep will be here confirmed on 10/07/22 CS as a surgical intervention.  The patient's history has been reviewed, patient examined, no change in status, stable for surgery.  I have reviewed the patient's chart and labs.  Questions were answered to the patient's satisfaction.     [name]

## 2022-10-16 NOTE — Anesthesia Preprocedure Evaluation (Signed)
Anesthesia Evaluation  Patient identified by MRN, date of birth, ID band Patient awake    Reviewed: Allergy & Precautions, NPO status , Patient's Chart, lab work & pertinent test results  Airway Mallampati: II  TM Distance: >3 FB Neck ROM: Full    Dental no notable dental hx. (+) Dental Advisory Given   Pulmonary neg pulmonary ROS   Pulmonary exam normal        Cardiovascular hypertension, Pt. on medications Normal cardiovascular exam     Neuro/Psych  negative psych ROS   GI/Hepatic negative GI ROS, Neg liver ROS,,,  Endo/Other    Morbid obesity  Renal/GU negative Renal ROS     Musculoskeletal   Abdominal   Peds  Hematology   Anesthesia Other Findings   Reproductive/Obstetrics                             Anesthesia Physical Anesthesia Plan  ASA: 3  Anesthesia Plan: General   Post-op Pain Management:    Induction:   PONV Risk Score and Plan: 4 or greater and Ondansetron, Dexamethasone and Midazolam  Airway Management Planned: LMA  Additional Equipment:   Intra-op Plan:   Post-operative Plan: Extubation in OR  Informed Consent: I have reviewed the patients History and Physical, chart, labs and discussed the procedure including the risks, benefits and alternatives for the proposed anesthesia with the patient or authorized representative who has indicated his/her understanding and acceptance.     Dental advisory given  Plan Discussed with: CRNA and Anesthesiologist  Anesthesia Plan Comments:        Anesthesia Quick Evaluation

## 2022-10-17 ENCOUNTER — Encounter (HOSPITAL_COMMUNITY): Payer: Self-pay | Admitting: Obstetrics and Gynecology

## 2022-10-17 LAB — SURGICAL PATHOLOGY

## 2022-10-17 NOTE — Anesthesia Postprocedure Evaluation (Signed)
Anesthesia Post Note  Patient: Adaleena Alfonzo Beers  Procedure(s) Performed: [place] (Uterus)     Patient location during evaluation: PACU Anesthesia Type: General Level of consciousness: awake and alert Pain management: pain level controlled Vital Signs Assessment: post-procedure vital signs reviewed and stable Respiratory status: spontaneous breathing, nonlabored ventilation and respiratory function stable Cardiovascular status: blood pressure returned to baseline and stable Postop Assessment: no apparent nausea or vomiting Anesthetic complications: no   No notable events documented.  Last Vitals:  Vitals:   10/16/22 1215 10/16/22 1230  BP: 130/72 133/79  Pulse: 94 86  Resp: 16 17  Temp:  36.4 C  SpO2: 96% 99%    Last Pain:  Vitals:   10/16/22 1230  TempSrc:   PainSc: 0-No pain                 Shalawn Wynder

## 2022-10-18 ENCOUNTER — Encounter: Payer: Self-pay | Admitting: Psychiatry

## 2022-10-21 ENCOUNTER — Telehealth: Payer: Self-pay | Admitting: Psychiatry

## 2022-10-21 NOTE — Telephone Encounter (Signed)
please schedule in large bore machine sent to GI they obtain Novella Rob [phone]

## 2022-11-04 ENCOUNTER — Ambulatory Visit
Admission: RE | Admit: 2022-11-04 | Discharge: 2022-11-04 | Disposition: A | Discharge status: Home / Self Care | Payer: Managed Care, Other (non HMO) | Source: Ambulatory Visit | Attending: Psychiatry

## 2022-11-04 DIAGNOSIS — G500 Trigeminal neuralgia: Secondary | ICD-10-CM

## 2022-11-04 DIAGNOSIS — R519 Headache, unspecified: Secondary | ICD-10-CM

## 2022-11-04 MED ORDER — GADOPICLENOL 0.5 MMOL/ML IV SOLN: 10.0000 mL | Freq: Once | INTRAVENOUS | Status: DC | PRN

## 2022-11-04 MED ORDER — GADOPICLENOL 0.5 MMOL/ML IV SOLN
10.0000 mL | Freq: Once | INTRAVENOUS | Status: AC | PRN
  Administered 2022-11-04: 10 mL via INTRAVENOUS

## 2022-11-05 ENCOUNTER — Encounter (INDEPENDENT_AMBULATORY_CARE_PROVIDER_SITE_OTHER): Payer: Managed Care, Other (non HMO) | Admitting: Psychiatry

## 2022-11-05 DIAGNOSIS — G8929 Other chronic pain: Secondary | ICD-10-CM

## 2022-11-05 DIAGNOSIS — R519 Headache, unspecified: Secondary | ICD-10-CM

## 2022-11-05 NOTE — Telephone Encounter (Signed)

## 2022-11-07 MED ORDER — GABAPENTIN 300 MG PO CAPS: ORAL_CAPSULE | ORAL | 6 refills | Status: DC

## 2022-11-07 NOTE — Addendum Note (Signed)
Addended by: Darleen Crocker on: 11/07/2022 07:24 AM   Modules accepted: Orders

## 2022-11-20 ENCOUNTER — Ambulatory Visit: Payer: Managed Care, Other (non HMO) | Admitting: Neurology

## 2022-12-18 ENCOUNTER — Ambulatory Visit
Admission: RE | Admit: 2022-12-18 | Discharge: 2022-12-18 | Disposition: A | Discharge status: Home / Self Care | Payer: Managed Care, Other (non HMO) | Source: Ambulatory Visit | Attending: Physician Assistant | Admitting: Physician Assistant

## 2022-12-18 DIAGNOSIS — Z1231 Encounter for screening mammogram for malignant neoplasm of breast: Secondary | ICD-10-CM

## 2023-01-16 ENCOUNTER — Ambulatory Visit: Payer: Managed Care, Other (non HMO) | Admitting: Adult Health

## 2023-01-30 NOTE — Progress Notes (Signed)
[facility]  PATIENT: Erin Ross DOB: [birth year]  REFERRING CLINICIAN: Lennie Odor, PA HISTORY FROM: self REASON FOR VISIT: facial pain   HISTORICAL  CHIEF COMPLAINT:  Chief Complaint  Patient presents with   Facial Pain    RM 3 alone Pt is well and stable, facial pain has improved since last visit.    Follow-up visit:  Prior visit: 10/11/2022   Brief HPI:  Erin Ross is a 57 y.o. 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  At prior visit, was started on gabapentin with gradual titration and baclofen as needed.  Completed MRI brain and MRI face/trigeminal which were both unremarkable.   Interval history:  Reports improvement of facial pain since prior visit. Only a couple episodes of pain in January, and only 3-4 times in December. Stopped gabapentin beginning of December as pain improved. Pain only lasts a couple hours, did use baclofen with pain in December with improvement. Pain not severe enough for January to take baclofen or any other OTC pain relievers.  She also mentions over the past several days, she has been having issues with diarrhea, she has been trying to eat more bland foods but diarrhea has persisted.  Denies any recent medication changes or recent antibiotic use.  Has been ensuring she maintains adequate hydration.  Denies any vomiting or fevers.    FACIAL PAIN FEATURES  Side: right  Distribution: V1-3 Any pain on side or back of head: yes Character: throbbing, no electrical pains Duration: 30 minutes Pain-free between episodes: yes Triggers: drinking, brushing teeth, touching lip Sensory abnormalities: no Tried Tegretol/Trileptal: no Prior procedures/outcome: none History of MS, Lyme's disease, facial rash: no History of dental/oral surgery,  facial/plastic surgery: no   OTHER MEDICAL CONDITIONS: HTN   REVIEW OF SYSTEMS: Full 14 system review of systems performed and negative with exception of: facial pain, diarrhea  ALLERGIES: Allergies  Allergen Reactions   Penicillins Hives    HOME MEDICATIONS: Outpatient Medications Prior to Visit  Medication Sig Dispense Refill   acetaminophen (TYLENOL) 500 MG tablet Take 500 mg by mouth every 6 (six) hours as needed (pain.).     amLODipine (NORVASC) 5 MG tablet 1 tablet Orally Once a day for high blood pressure for 60 days     aspirin EC 81 MG tablet Take 81 mg by mouth in the morning.     baclofen (LIORESAL) 10 MG tablet Take 1/2 pill up to three times a day as needed for facial pain 30 each 6   cetirizine (ZYRTEC) 10 MG tablet Take 10 mg by mouth daily as needed for allergies.     diclofenac (CATAFLAM) 50 MG tablet Take 50 mg by mouth daily as needed (pain.).     gabapentin (NEURONTIN) 300 MG capsule 1 pill in the morning, 1 in the afternoon, and 2 at bedtime 120 capsule 6   ibuprofen (ADVIL) 200 MG tablet Take 400 mg by mouth every 8 (eight) hours as needed (pain.).     ibuprofen (ADVIL) 800 MG tablet Take 1 tablet (800 mg total) by mouth every 8 (eight) hours as needed for mild pain, moderate pain or cramping. Do not take if using Diclofenac or other NSAIDs. 30 tablet 0   lisinopril-hydrochlorothiazide (PRINZIDE,ZESTORETIC) 20-12.5 MG per tablet Take 1 tablet by mouth daily.     LORazepam (ATIVAN) 0.5 MG  tablet Take 1-2 pills 30 minutes prior to MRI 2 tablet 0   Multiple Vitamin (MULTIVITAMIN WITH MINERALS) TABS tablet Take 1 tablet by mouth daily with lunch.     Omega-3 Fatty Acids (FISH OIL) 1200 MG CAPS Take by mouth daily with breakfast.     vitamin D3 (CHOLECALCIFEROL) 25 MCG tablet Take 1,000 Units by mouth every other day. In the morning     zinc sulfate 220 (50 Zn) MG capsule Take 220 mg by mouth daily with breakfast.     No facility-administered medications prior to  visit.    PAST MEDICAL HISTORY: Past Medical History:  Diagnosis Date   COVID-19 07/2022   Endometrial polyp 08/07/2022   benign   Facial pain    Fibroids 04/26/2022   benign uterine fibroids   Hypertension    Neuromuscular disorder ([place])    nerve - face hurts   PMB (postmenopausal bleeding) 04/26/2022   Pre-diabetes 01/2022   HgbA1C was 6.7    PAST SURGICAL HISTORY: Past Surgical History:  Procedure Laterality Date   BREAST CYST ASPIRATION     local   DILATATION & CURETTAGE/HYSTEROSCOPY WITH MYOSURE N/A 10/16/2022   Procedure: DILATATION & CURETTAGE/HYSTEROSCOPY WITH MYOSURE;  Surgeon: [name], DO;  Location: [place];  Service: Gynecology;  Laterality: N/A;   LEG SURGERY Right    57 months old- had fluid removed    FAMILY HISTORY: Family History  Problem Relation Age of Onset   Hypertension Mother    High blood pressure Mother    High Cholesterol Mother    High blood pressure Father    Heart disease Brother    Heart disease Brother    Heart disease Brother    Heart disease Brother     SOCIAL HISTORY: Social History   Socioeconomic History   Marital status: Single    Spouse name: Not on file   Number of children: 0   Years of education: 14   Highest education level: Not on file  Occupational History   Not on file  Tobacco Use   Smoking status: Never   Smokeless tobacco: Never  Vaping Use   Vaping Use: Never used  Substance and Sexual Activity   Alcohol use: No   Drug use: No   Sexual activity: Yes    Birth control/protection: Post-menopausal  Other Topics Concern   Not on file  Social History Narrative   Right handed   Caffeine use: Diet Ginger ale   Social Determinants of Health   Financial Resource Strain: Not on file  Food Insecurity: Not on file  Transportation Needs: Not on file  Physical Activity: Not on file  Stress: Not on file  Social Connections: Not on file  Intimate Partner Violence: Not on file     PHYSICAL  EXAM  GENERAL EXAM/CONSTITUTIONAL: Vitals:  Vitals:   02/03/23 1304  BP: 117/68  Pulse: 80  Weight: 259 lb (117.5 kg)  Height: '5\' 7"'$  (1.702 m)   Body mass index is 40.57 kg/m. Wt Readings from Last 3 Encounters:  02/03/23 259 lb (117.5 kg)  10/16/22 260 lb (117.9 kg)  10/11/22 260 lb (117.9 kg)    NEUROLOGIC: MENTAL STATUS:  awake, alert, oriented to person, place and time recent and remote memory intact normal attention and concentration  CRANIAL NERVE:  2nd, 3rd, 4th, 6th - pupils equal and reactive to light, visual fields full to confrontation, extraocular muscles intact, no nystagmus 5th - facial sensation symmetric 7th - facial strength symmetric 8th -  hearing intact 9th - palate elevates symmetrically, uvula midline 11th - shoulder shrug symmetric 12th - tongue protrusion midline  MOTOR:  normal bulk and tone, full strength in the BUE, BLE  SENSORY:  normal and symmetric to light touch all 4 extremities  COORDINATION:  finger-nose-finger intact bilaterally  REFLEXES:  deep tendon reflexes present and symmetric  GAIT/STATION:  normal     DIAGNOSTIC DATA (LABS, IMAGING, TESTING) - I reviewed patient records, labs, notes, testing and imaging myself where available.  [place] personally reviewed 10/11/22 with no acute process   ASSESSMENT AND PLAN  57 y.o. year old female with a history of HTN who presents for follow up of right sided facial pain which began in August 2023 after a COVID infection, was initially seen by Dr. Billey Gosling on 10/11/2022 and felt symptoms persistent with idiopathic facial pain possibly triggered by COVID infection.  MRI brain unremarkable.  MRI face/trigeminal nerves normal. Symptoms have improved on their own, only a couple episodes of pain last month without intervention needed. She has also been having issues with diarrhea over the past several days.    No diagnosis found.   PLAN: -Continue baclofen 5-10 mg TID PRN for  breakthrough pain, can also use gabapentin '300mg'$  PRN for moderate to severe pain -symptoms have been gradually improving and hopefully will continue to improve  -advised her to continue adequate hydration in setting of diarrhea. Can try use of Imodium or bulking agent to help but if symptoms persist, she should be seen by PCP to further evaluate and rule out any infection. She verbalized understanding.     Return if symptoms worsen or fail to improve.    I spent 18 minutes of face-to-face and non-face-to-face time with patient.  This included previsit chart review, lab review, study review, order entry, electronic health record documentation, patient education and discussion regarding the above and answered all the questions to patient satisfaction  Frann Rider, [facility]  [facility] [address] [place],  [address]  Phone [phone] Fax [phone] Note: This document was prepared with digital dictation and possible smart phrase technology. Any transcriptional errors that result from this process are unintentional.

## 2023-02-03 ENCOUNTER — Encounter: Payer: Self-pay | Admitting: Adult Health

## 2023-02-03 ENCOUNTER — Ambulatory Visit: Payer: Managed Care, Other (non HMO) | Admitting: Adult Health

## 2023-02-03 VITALS — BP 117/68 | HR 80 | Ht 67.0 in | Wt 259.0 lb

## 2023-02-03 DIAGNOSIS — R519 Headache, unspecified: Secondary | ICD-10-CM | POA: Diagnosis not present

## 2023-02-03 DIAGNOSIS — G8929 Other chronic pain: Secondary | ICD-10-CM | POA: Diagnosis not present

## 2023-02-03 NOTE — Patient Instructions (Addendum)
Your Plan:  Continue use of baclofen as needed for breakthrough pain - hopefully symptoms will continue to improve as time goes on. Can also use gabapentin as needed for more severe pain. If pain returns and becomes persist, please let us know  Can try Imodium or bulking agent for diarrhea, continue hydration, if symptoms persist please follow up with your primary doctor     Follow up as needed at this time     Thank you for coming to see [place] at [facility]. I hope we have been able to provide you high quality care today.  You may receive a patient satisfaction survey over the next few weeks. We would appreciate your feedback and comments so that we may continue to improve ourselves and the health of our patients.

## 2023-07-17 ENCOUNTER — Other Ambulatory Visit: Payer: Self-pay | Admitting: Physician Assistant

## 2023-07-17 DIAGNOSIS — Z0000 Encounter for general adult medical examination without abnormal findings: Secondary | ICD-10-CM

## 2023-10-15 IMAGING — MG MM DIGITAL SCREENING BILAT W/ TOMO AND CAD
6 of 10 series · 6 of 30 positions shown · non-contrast
Comparison: Previous exam(s).

CLINICAL DATA: Screening.

EXAM:
DIGITAL SCREENING BILATERAL MAMMOGRAM WITH TOMOSYNTHESIS AND CAD
TECHNIQUE: Bilateral screening digital craniocaudal and mediolateral oblique
mammograms were obtained. Bilateral screening digital breast
tomosynthesis was performed. The images were evaluated with
computer-aided detection.

[R MLO synth-2D (1 of 2)]
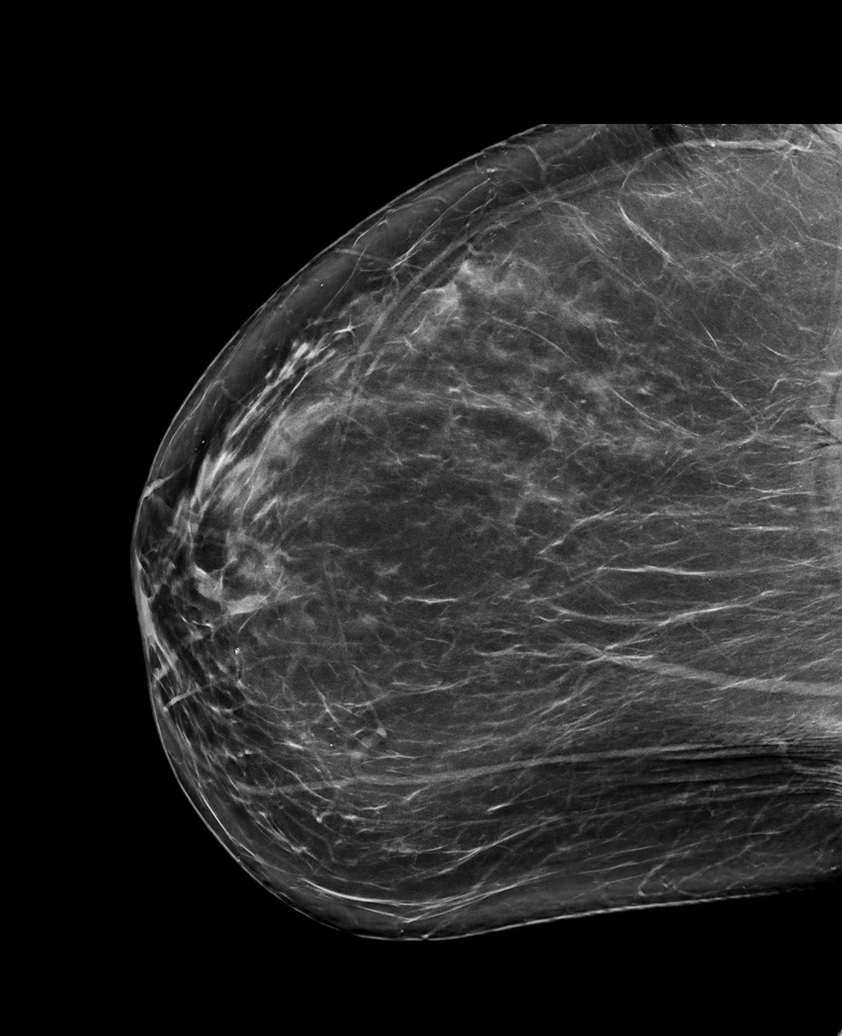

[L MLO synth-2D]
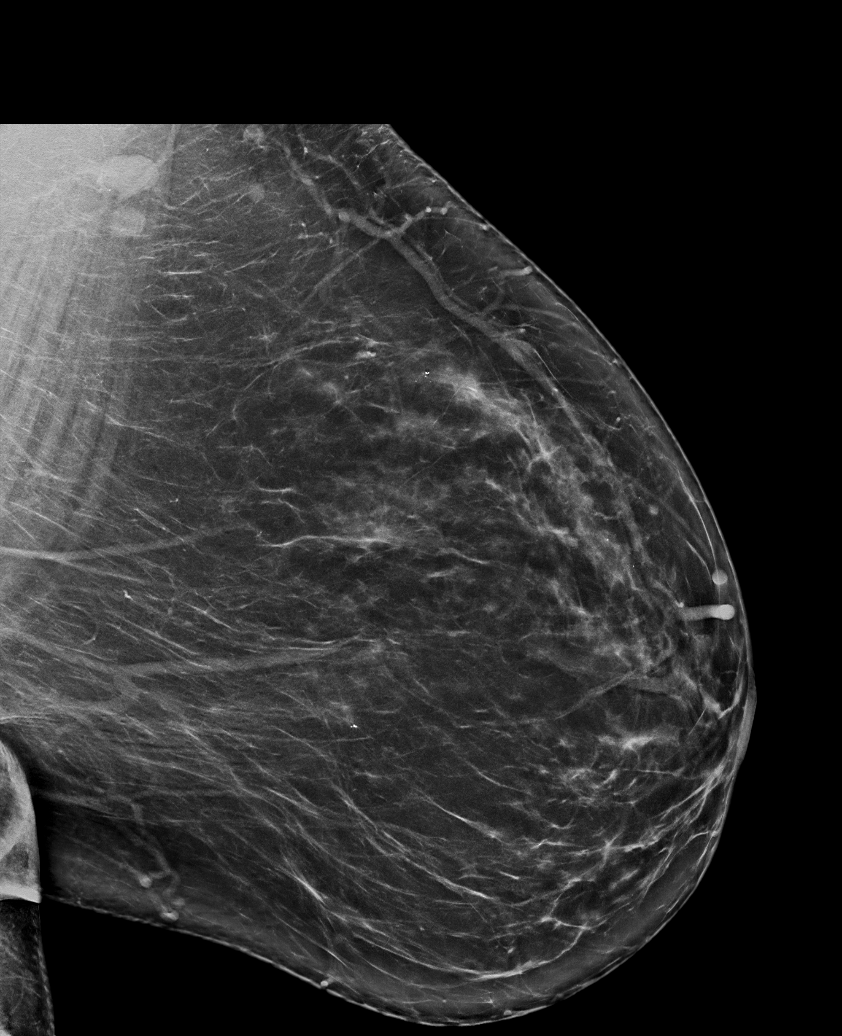

[R MLO synth-2D (2 of 2)]
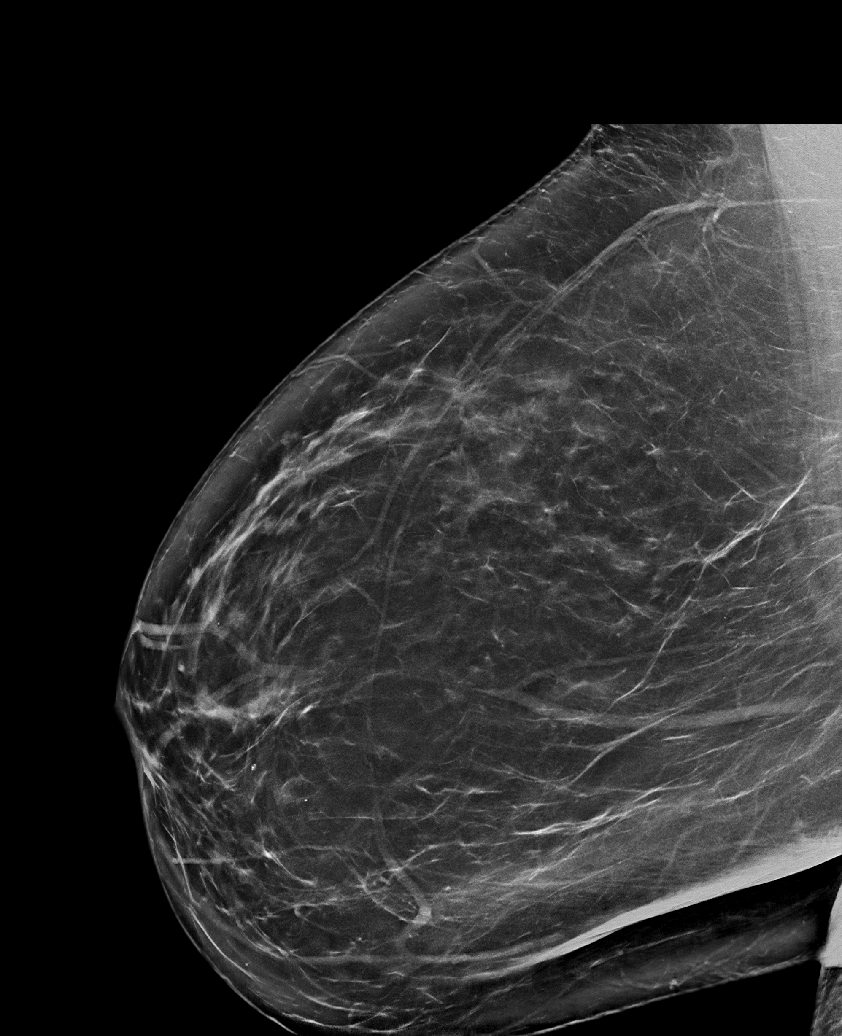

[L CC synth-2D]
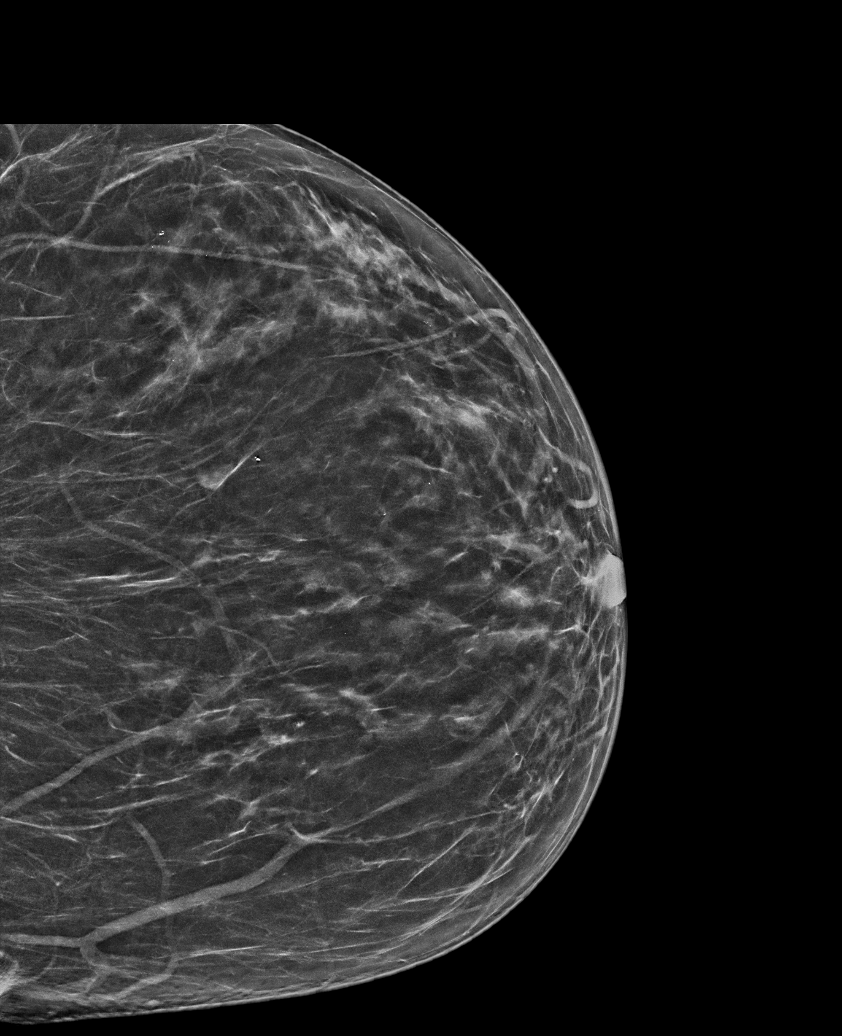

[R CC synth-2D]
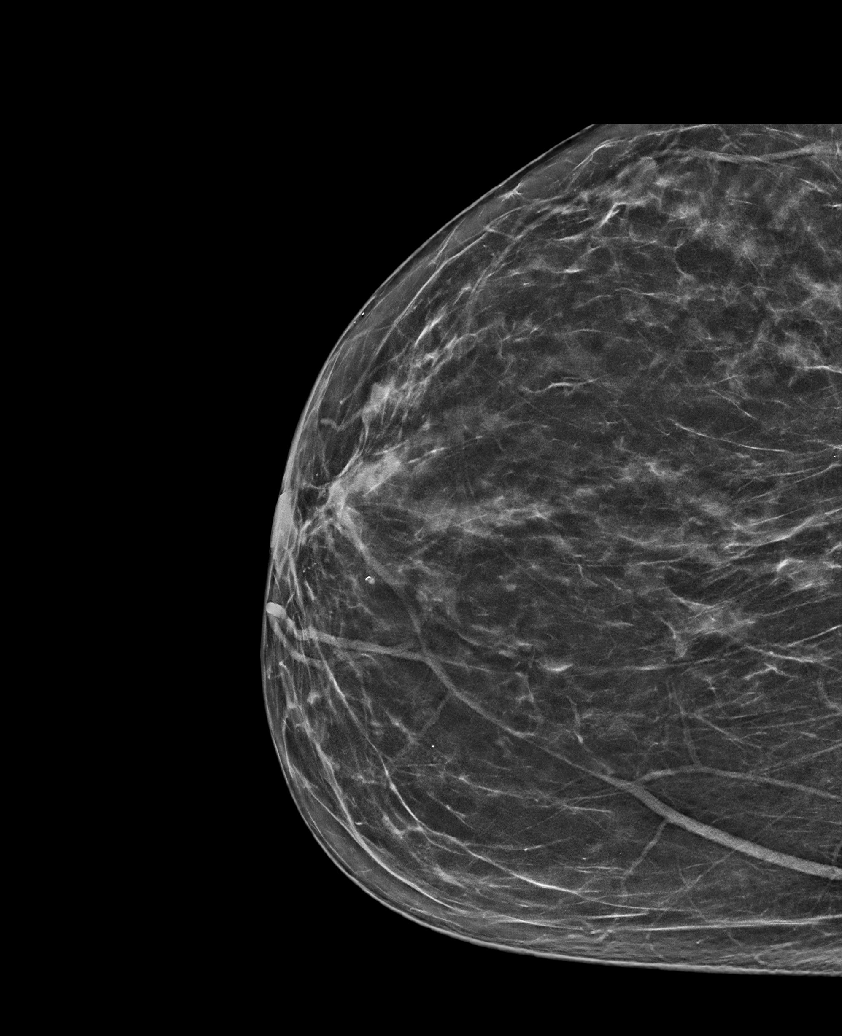

[R CC tomo · tomo slice 37/74.0]
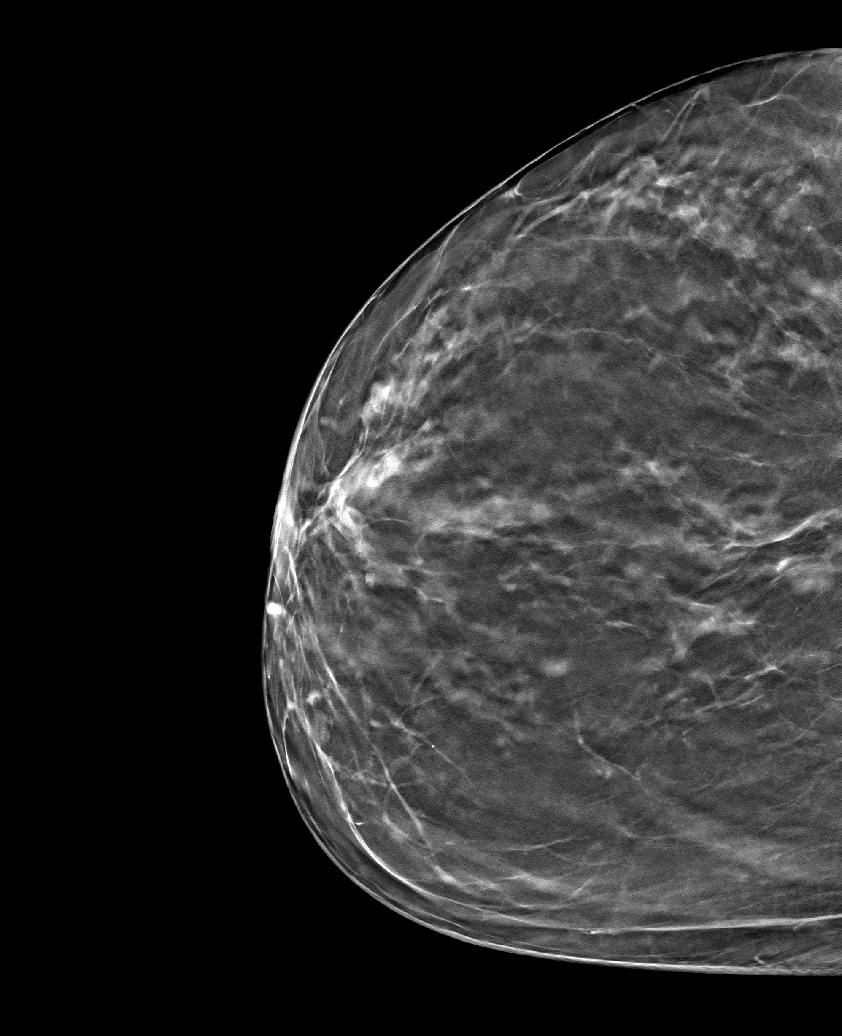

[6 of 30 positions shown; findings below may reference images not displayed]

ACR Breast Density Category b: There are scattered areas of
fibroglandular density.
FINDINGS: There are no findings suspicious for malignancy.
IMPRESSION: No mammographic evidence of malignancy. A result letter of this
screening mammogram will be mailed directly to the patient.

RECOMMENDATION:
Screening mammogram in one year. (Code:51-O-LD2)

BI-RADS CATEGORY  1: Negative.

## 2023-12-22 ENCOUNTER — Ambulatory Visit
Admission: RE | Admit: 2023-12-22 | Discharge: 2023-12-22 | Disposition: A | Discharge status: Home / Self Care | Payer: Managed Care, Other (non HMO) | Source: Ambulatory Visit | Attending: Physician Assistant | Admitting: Physician Assistant

## 2023-12-22 DIAGNOSIS — Z0000 Encounter for general adult medical examination without abnormal findings: Secondary | ICD-10-CM

## 2024-03-22 NOTE — H&P (Signed)
 Erin Ross is an 58 y.o. postmenopausal G0 who is admitted for Total Abdominal Hysterectomy with Bilateral Salpingo-oophorectomy due to fibroid uterus and history of PMB.  Patient is s/p Hysteroscopy D&C on 10/15/2022 for removal of endometrial polyp. Has not had any issues with PMB since that time; however, she had an abdominal X-ray which showed calcifications within the abdomen. Repeat TVUS revealed growth of her uterus from 9.5cm to 17cm in 2 years due to fibroids. Repeat scan (as documented below) showed stability in the size of her uterus/fibroids. Recommended definitive management which she desires.  Work-up: Pap smear (04/26/2022): NILM/HRHPV negative  D&C: (10/15/2022): Benign endometrial polyp, scant benign nonpolypoid inactive endometrium, negative for hyperplasia/malignancy  EMB (04/26/2022): Benign endometrial polyp, additional fragments of benign endometrial surface epithelium, negative for hyperplasia and malignancy   10/28/2023 TVUS Uterus: 17.03 x 12.12 x 11.67 cm Fibroid 1 7.75 cm  Fibroid 2 9.37 cm  Fibroid 3 5.15 cm Fibroid 4 7.17 cm Left Ovary 2.30 cm Comments:Anteverted uterus. Uterus enlarged with large amount of calcified fibroids seen within- questionable classified fibroids difficult to clear evaluate/determine due to large amount of shadowing.Questionable uterine fibroids- fundal anterior calcified 7.8 x 7.6 x 5.7 cm, questionable fundal calcified 9.4 x 9.1 x 6.2 cm,questionable left calcified 5.2 x 5.1 x 4.0 cm, questionable posterior lower uterine segment pedunculated 8.2 x 7.2 x 7.1 cm, stable from previous US 07/22/2023.Endometrium not visualized. Cervix not well visualized.Left ovary WNL. Right ovary not visualized. No adnexal masses seen.Pelvic US very limited due to inability to visualize anatomy clearly due to shadowing.  07/22/2023 TVUS Uterus: 17.03 x 13.62 x 11.17 cm Right Ovary 2.43 cm  Left Ovary 2.16 cm Fibroid 1 7.85 cm  Fibroid 2 9.67 cm   Fibroid 3 4.41 cm Comments: Anteverted uterus. Uterus enlarged with large amount of calcifications seen. Questionable calcified fibroids difficult to clearly evaluate due to large amount of shadowing; questionably calcified fundal anterior calcified 7.9 x 7.7 x 5.9 cm, fundal calcified 9.7 x 9.4 x 5.9 cm, left calcified 5.4 x 4.8 x 4.6 cm. Endometrium not visualized.Bilateral ovaries WNL. No adnexal masses seenScanned both transvaginally and transabdominally both views limited due to uterine size and calcifications.  11/19/2021 TVUS Uterus: 9.5 x 6.1 x 6.6 cm. Lobulated contour with multiple fibroids.Posterior uterine corpus exophytic fibroid measuring 2.9 x 3.7 x 3.7 cm. Partially calcified fundal fibroid measuring 3.7 x 3.4 x 3.9 cm. Submucosal calcified fibroid in the uterine corpus measuring 2.2 x 1.9 x 2.2 cmEndometrial thickness: 5.5 mm. No focal abnormality visualized.Right ovary not visualized.Left ovary 3.8 x 2.3 x 1.9 cm. No adnexal mass.No abrnomal free fluid. IMPRESSION:1. Slightly enlarged uterus with multiple uterine fibroids2. Nonvisualized right ovary.  Patient Active Problem List   Diagnosis Date Noted   Uterine leiomyoma 04/03/2024   Pure hypercholesterolemia 04/03/2024   Perennial allergic rhinitis 04/03/2024   Lightheaded 09/19/2022   Postmenopausal bleeding 08/07/2022   Overweight or obesity 03/03/2014    MEDICAL/FAMILY/SOCIAL HX: Patient's last menstrual period was 09/17/2017.    Past Medical History:  Diagnosis Date   COVID-19 07/2022   Endometrial polyp 08/07/2022   benign   Facial pain    Fibroids 04/26/2022   benign uterine fibroids   Hypertension    Neuromuscular disorder ([facility])    nerve - face hurts   PMB (postmenopausal bleeding) 04/26/2022   Pre-diabetes 01/2022   HgbA1C was 6.7    Past Surgical History:  Procedure Laterality Date   BREAST CYST ASPIRATION     local  DILATATION & CURETTAGE/HYSTEROSCOPY WITH MYOSURE N/A 10/16/2022   Procedure:  DILATATION & CURETTAGE/HYSTEROSCOPY WITH MYOSURE;  Surgeon: Steva Ready, DO;  Location: [facility] OR;  Service: Gynecology;  Laterality: N/A;   LEG SURGERY Right    85 months old- had fluid removed    Family History  Problem Relation Age of Onset   Hypertension Mother    High blood pressure Mother    High Cholesterol Mother    High blood pressure Father    Heart disease Brother    Heart disease Brother    Heart disease Brother    Heart disease Brother     Social History:  reports that she has never smoked. She has never used smokeless tobacco. She reports that she does not drink alcohol and does not use drugs.  ALLERGIES/MEDS:  Allergies:  Allergies  Allergen Reactions   Penicillins Hives    No medications prior to admission.     Review of Systems  All other systems reviewed and are negative.   Last menstrual period 09/17/2017. Gen:  NAD, pleasant and cooperative Cardio:  RRR Pulm:  CTAB, no wheezes/rales/rhonchi Abd:  Soft, non-distended, non-tender throughout, no rebound/guarding, enlarged ~17cm uterus palpated Ext:  No bilateral LE edema, no bilateral calf tenderness   No results found for this or any previous visit (from the past 24 hours).  No results found.   ASSESSMENT/PLAN: Erin Ross is a 58 y.o. postmenopausal G0 who is admitted for Total Abdominal Hysterectomy with Bilateral Salpingo-oophorectomy due to fibroid uterus and history of PMB.  - Admit to [facility] Main OR - Admit labs (CBC, T&S) - Diet:  Per anesthesia/ERAS [place] - IVF:  Per anesthesia  - VTE Prophylaxis:  SCDs - Antibiotics: Gent/Clinda (PCN allergy) - D/C home POD#1  Consents: I have explained to the patient that this surgery is performed to remove the uterus through an incision in the abdomen and that it will result in sterility.  I discussed the risks and benefits of the surgery, including, but not limited to bleeding, including the need for a blood transfusion, infection,  damage to surrounding organs and tissues, damage to bladder, damage to ureters, causing kidney damage, and requiring additional procedures, damage to bowels, resulting in further surgery, postoperative pain, short-term and long-term, scarring on the abdominal wall and intra-abdominally, need for further surgery, development of an incisional hernia, deep vein thrombosis and/or pulmonary embolism, wound infection and/or separation, painful intercourse, urinary leakage, ovarian failure, resulting in menopausal symptoms requiring treatment, fistula formation, complications the course of which cannot be predicted or prevented, and death.  Patient was consented for blood products.  The patient is aware that bleeding may result in the need for a blood transfusion which includes risk of transmission of HIV (1:2 million), Hepatitis C (1:2 million), and Hepatitis B (1:200 thousand) and transfusion reaction.  Patient voiced understanding of the above risks as well as understanding of indications for blood transfusion.    Steva Ready, DO

## 2024-04-03 DIAGNOSIS — E7800 Pure hypercholesterolemia, unspecified: Secondary | ICD-10-CM | POA: Insufficient documentation

## 2024-04-03 DIAGNOSIS — D259 Leiomyoma of uterus, unspecified: Secondary | ICD-10-CM | POA: Insufficient documentation

## 2024-04-03 DIAGNOSIS — J3089 Other allergic rhinitis: Secondary | ICD-10-CM | POA: Insufficient documentation

## 2024-04-09 ENCOUNTER — Encounter (HOSPITAL_COMMUNITY): Payer: Self-pay | Admitting: Obstetrics and Gynecology

## 2024-04-19 ENCOUNTER — Encounter (HOSPITAL_COMMUNITY): Payer: Self-pay | Admitting: Obstetrics and Gynecology

## 2024-04-19 NOTE — Pre-Procedure Instructions (Signed)
 Surgical Instructions   Your procedure is scheduled on :  Wednesday,  04-21-2024. Report to [facility] Main Entrance "A" at 5:30  A.M., then check in with the Admitting office. Any questions or running late day of surgery: call [phone]  Questions prior to your surgery date: call [phone], Monday-Friday, 8am-4pm. If you experience any cold or flu symptoms such as cough, fever, chills, shortness of breath, etc. between now and your scheduled surgery, please notify your surgeon office.    Remember:  Do not eat any food after midnight the night before your surgery. Your may have clear liquids from midnight night before surgery until 4:30 AM.   You may drink clear liquids until 4:30 AM the morning of your surgery.   Clear liquids allowed are:  Water  Carbonated Beverages (diet or low sugar only) Clear Tea (may use non-sweetener, no milk, honey, etc.) Black Coffee Only (may use non-sweetener, NO MILK, CREAM OR POWDERED CREAMER of any kind) Sports drink, like Gatorade  (no sugar or sugar-free) NO clear liquids after 4:30 AM day of surgery.  This includes no water ,  candy,  gum,  and  mints.    Take these medicines the morning of surgery with A SIP OF WATER  : Amlodipine (norvasc) Omeprazole (prilosec) Cetirizine (zyrtec)   May take these medicines IF NEEDED: Fluticasone (flonase) nasal spray    One week prior to surgery, STOP taking any Aspirin (unless otherwise instructed by your surgeon) Aleve , Naproxen , Ibuprofen , Motrin , Advil , Goody's, BC's, all herbal medications, fish oil, and non-prescription vitamins.           You will be asked to remove any contacts, glasses, piercing's, hearing aid's, dentures/partials prior to surgery. Please bring cases for these items if needed.    Patients discharged the day of surgery will not be allowed to drive home, and someone needs to stay with them for 24 hours.  SURGICAL WAITING ROOM VISITATION Patients may have no more than 2  support people in the waiting area - these visitors may rotate.   Pre-op nurse will coordinate an appropriate time for 1 ADULT support person, who may not rotate, to accompany patient in pre-op.  Children under the age of 48 must have an adult with them who is not the patient and must remain in the main waiting area with an adult.  If the patient needs to stay at the hospital during part of their recovery, the visitor guidelines for inpatient rooms apply.  Please refer to the [facility] website for the visitor guidelines for any additional information.   If you received a COVID test during your pre-op visit  it is requested that you wear a mask when out in public, stay away from anyone that may not be feeling well and notify your surgeon if you develop symptoms. If you have been in contact with anyone that has tested positive in the last 10 days please notify you surgeon.      Pre-operative CHG Bathing Instructions   You can play a key role in reducing the risk of infection after surgery. Your skin needs to be as free of germs as possible. You can reduce the number of germs on your skin by washing with CHG (chlorhexidine  gluconate) soap before surgery. CHG is an antiseptic soap that kills germs and continues to kill germs even after washing.   DO NOT use if you have an allergy to chlorhexidine /CHG or antibacterial soaps. If your skin becomes reddened or irritated, stop using the CHG and notify  Pre-Op nurse day of surgery.  Please get dial soap or other antibacterial soap and shower following the instructions below.              TAKE A SHOWER THE NIGHT BEFORE SURGERY AND THE DAY OF SURGERY    Please keep in mind the following:  DO NOT shave, including legs and underarms, 48 hours prior to surgery.   You may shave your face before/day of surgery.  Place clean sheets on your bed the night before surgery Use a clean washcloth (not used since being washed) for each shower. DO NOT sleep with  pet's night before surgery.  CHG Shower Instructions:  Wash your face and private area with normal soap. If you choose to wash your hair, wash first with your normal shampoo.  After you use shampoo/soap, rinse your hair and body thoroughly to remove shampoo/soap residue.  Turn the water  OFF and apply half the bottle of CHG soap to a CLEAN washcloth.  Apply CHG soap ONLY FROM YOUR NECK DOWN TO YOUR TOES (washing for 3-5 minutes)  DO NOT use CHG soap on face, private areas, open wounds, or sores.  Pay special attention to the area where your surgery is being performed.  If you are having back surgery, having someone wash your back for you may be helpful. Wait 2 minutes after CHG soap is applied, then you may rinse off the CHG soap.  Pat dry with a clean towel  Put on clean pajamas    Additional instructions for the day of surgery: DO NOT APPLY any lotions,  oils, deodorants (may use underarm deodorant) , cologne/  perfumes  or makeup  Do not wear jewelry /  piercing's /  metal/  permanent must be removed prior to arrival day of surgery  (this includes to plastic) Do not wear nail polish, gel polish, artificial nails, or any other type of covering on natural finger nails (toe nails are okay) Do not bring valuables to the hospital. [facility] is not responsible for valuables/personal belongings. Put on clean/comfortable clothes.  Please brush your teeth.  Ask your nurse before applying any prescription medications to the skin.

## 2024-04-19 NOTE — Progress Notes (Addendum)
 Addendum:   Received via [employer] fax pt's 12 lead EKG from PCP dated 02-27-2024 scanned in media, printed copy for chart.  Spoke w/ via phone for pre-op interview--- pt Lab needs dos----       no  Lab results------ lab appt 04-20-2024 @ 0930 getting CBC/ BMP/ T&S Pt stated had EKG done w/ PCP 02/ 2025 called to have faxed COVID test -----patient states asymptomatic no test needed Arrive at ------- 0530 on 04-21-2024 NPO after MN NO Solid Food.  Clear liquids from MN until--- 0430 Pre-Surgery Ensure or G2: n/a ERAS protocol:  yes  Med rec completed Medications to take morning of surgery ----- norvasc , prilosec Diabetic medication ----- n/a  GLP1 agonist last dose: n/a GLP1 instructions:  Patient instructed no nail polish to be worn day of surgery Patient instructed to bring photo id and insurance card day of surgery Patient aware to have Driver (ride ) / caregiver    for 24 hours after surgery - cousin, veronica & mother, carol Patient Special Instructions ----- will pick up bag w/ lab appt w/ soap and written instrutions Pre-Op special Instructions ----- n/a  Patient verbalized understanding of instructions that were given at this phone interview. Patient denies chest pain, sob, fever, cough at the interview.

## 2024-04-20 ENCOUNTER — Encounter (HOSPITAL_COMMUNITY)
Admission: RE | Admit: 2024-04-20 | Discharge: 2024-04-20 | Disposition: A | Discharge status: Home / Self Care | Source: Ambulatory Visit | Attending: Obstetrics and Gynecology | Admitting: Obstetrics and Gynecology

## 2024-04-20 DIAGNOSIS — Z01818 Encounter for other preprocedural examination: Secondary | ICD-10-CM

## 2024-04-20 DIAGNOSIS — D259 Leiomyoma of uterus, unspecified: Secondary | ICD-10-CM | POA: Insufficient documentation

## 2024-04-20 DIAGNOSIS — Z01812 Encounter for preprocedural laboratory examination: Secondary | ICD-10-CM | POA: Insufficient documentation

## 2024-04-20 LAB — CBC
HCT: 38.9 % (ref 36.0–46.0)
Hemoglobin: 12.8 g/dL (ref 12.0–15.0)
MCH: 28.4 pg (ref 26.0–34.0)
MCHC: 32.9 g/dL (ref 30.0–36.0)
MCV: 86.4 fL (ref 80.0–100.0)
Platelets: 321 10*3/uL (ref 150–400)
RBC: 4.5 MIL/uL (ref 3.87–5.11)
RDW: 13.1 % (ref 11.5–15.5)
WBC: 5.2 10*3/uL (ref 4.0–10.5)
nRBC: 0 % (ref 0.0–0.2)

## 2024-04-20 LAB — BASIC METABOLIC PANEL WITH GFR
Anion gap: 11 (ref 5–15)
BUN: 11 mg/dL (ref 6–20)
CO2: 26 mmol/L (ref 22–32)
Calcium: 9.4 mg/dL (ref 8.9–10.3)
Chloride: 106 mmol/L (ref 98–111)
Creatinine, Ser: 0.73 mg/dL (ref 0.44–1.00)
GFR, Estimated: >60 mL/min (ref >60)
Glucose, Bld: 110 mg/dL — ABNORMAL HIGH (ref 70–99)
Potassium: 3.9 mmol/L (ref 3.5–5.1)
Sodium: 143 mmol/L (ref 135–145)

## 2024-04-20 LAB — TYPE AND SCREEN
ABO/RH(D): O POS
Antibody Screen: NEGATIVE

## 2024-04-21 ENCOUNTER — Encounter (HOSPITAL_COMMUNITY)
Admission: RE | Disposition: A | Discharge status: Home / Self Care | Payer: Self-pay | Source: Home / Self Care | Attending: Obstetrics and Gynecology

## 2024-04-21 ENCOUNTER — Other Ambulatory Visit: Payer: Self-pay

## 2024-04-21 ENCOUNTER — Inpatient Hospital Stay (HOSPITAL_COMMUNITY)

## 2024-04-21 ENCOUNTER — Inpatient Hospital Stay (HOSPITAL_COMMUNITY)
Admission: RE | Admit: 2024-04-21 | Discharge: 2024-04-22 | DRG: 742 | Disposition: A | Discharge status: Home / Self Care | Payer: Managed Care, Other (non HMO) | Attending: Obstetrics and Gynecology | Admitting: Obstetrics and Gynecology

## 2024-04-21 ENCOUNTER — Encounter (HOSPITAL_COMMUNITY): Payer: Self-pay | Admitting: Obstetrics and Gynecology

## 2024-04-21 DIAGNOSIS — Z8616 Personal history of COVID-19: Secondary | ICD-10-CM | POA: Diagnosis not present

## 2024-04-21 DIAGNOSIS — K660 Peritoneal adhesions (postprocedural) (postinfection): Secondary | ICD-10-CM | POA: Diagnosis not present

## 2024-04-21 DIAGNOSIS — Z01818 Encounter for other preprocedural examination: Secondary | ICD-10-CM

## 2024-04-21 DIAGNOSIS — D259 Leiomyoma of uterus, unspecified: Secondary | ICD-10-CM | POA: Diagnosis present

## 2024-04-21 DIAGNOSIS — I10 Essential (primary) hypertension: Secondary | ICD-10-CM

## 2024-04-21 DIAGNOSIS — N950 Postmenopausal bleeding: Secondary | ICD-10-CM | POA: Diagnosis present

## 2024-04-21 DIAGNOSIS — Z880 Allergy status to penicillin: Secondary | ICD-10-CM

## 2024-04-21 DIAGNOSIS — K219 Gastro-esophageal reflux disease without esophagitis: Secondary | ICD-10-CM | POA: Diagnosis present

## 2024-04-21 DIAGNOSIS — Z6841 Body Mass Index (BMI) 40.0 and over, adult: Secondary | ICD-10-CM

## 2024-04-21 HISTORY — PX: CYSTOSCOPY: SHX5120

## 2024-04-21 HISTORY — DX: Headache, unspecified: R51.9

## 2024-04-21 HISTORY — DX: Hyperlipidemia, unspecified: E78.5

## 2024-04-21 HISTORY — PX: HYSTERECTOMY ABDOMINAL WITH SALPINGECTOMY: SHX6725

## 2024-04-21 HISTORY — DX: Other allergic rhinitis: J30.89

## 2024-04-21 HISTORY — DX: Gastro-esophageal reflux disease without esophagitis: K21.9

## 2024-04-21 HISTORY — DX: Leiomyoma of uterus, unspecified: D25.9

## 2024-04-21 SURGERY — HYSTERECTOMY, TOTAL, ABDOMINAL, WITH SALPINGECTOMY
Anesthesia: General | Site: Bladder

## 2024-04-21 MED ORDER — DOCUSATE SODIUM 100 MG PO CAPS
100.0000 mg | ORAL_CAPSULE | Freq: Two times a day (BID) | ORAL | Status: DC
  Administered 2024-04-21 – 2024-04-22 (×3): 100 mg via ORAL
  Filled 2024-04-21 (×3): qty 1

## 2024-04-21 MED ORDER — GLYCOPYRROLATE PF 0.2 MG/ML IJ SOSY
PREFILLED_SYRINGE | INTRAMUSCULAR | Status: DC | PRN
  Administered 2024-04-21: .4 mg via INTRAVENOUS

## 2024-04-21 MED ORDER — ACETAMINOPHEN 500 MG PO TABS
ORAL_TABLET | ORAL | Status: AC
Start: 2024-04-21 — End: 2024-04-21
  Filled 2024-04-21: qty 2

## 2024-04-21 MED ORDER — KETOROLAC TROMETHAMINE 30 MG/ML IJ SOLN
30.0000 mg | Freq: Once | INTRAMUSCULAR | Status: AC | PRN
  Administered 2024-04-21: 30 mg via INTRAVENOUS

## 2024-04-21 MED ORDER — LIDOCAINE 2% (20 MG/ML) 5 ML SYRINGE
INTRAMUSCULAR | Status: DC | PRN
  Administered 2024-04-21: 60 mg via INTRAVENOUS
  Administered 2024-04-21: 2 mg via INTRAVENOUS

## 2024-04-21 MED ORDER — PROPOFOL 1000 MG/100ML IV EMUL
INTRAVENOUS | Status: AC
  Filled 2024-04-21: qty 200

## 2024-04-21 MED ORDER — PROPOFOL 1000 MG/100ML IV EMUL
INTRAVENOUS | Status: AC
  Filled 2024-04-21: qty 100

## 2024-04-21 MED ORDER — SIMETHICONE 80 MG PO CHEW: 80.0000 mg | CHEWABLE_TABLET | Freq: Four times a day (QID) | ORAL | Status: DC | PRN

## 2024-04-21 MED ORDER — TRANEXAMIC ACID-NACL 1000-0.7 MG/100ML-% IV SOLN
INTRAVENOUS | Status: DC | PRN
Start: 2024-04-21 — End: 2024-04-21
  Administered 2024-04-21: 1000 mg via INTRAVENOUS

## 2024-04-21 MED ORDER — DEXAMETHASONE SODIUM PHOSPHATE 10 MG/ML IJ SOLN
INTRAMUSCULAR | Status: AC
  Filled 2024-04-21: qty 1

## 2024-04-21 MED ORDER — HEMOSTATIC AGENTS (NO CHARGE) OPTIME
TOPICAL | Status: DC | PRN
  Administered 2024-04-21: 1

## 2024-04-21 MED ORDER — DEXAMETHASONE SODIUM PHOSPHATE 10 MG/ML IJ SOLN
INTRAMUSCULAR | Status: DC | PRN
  Administered 2024-04-21: 10 mg via INTRAVENOUS

## 2024-04-21 MED ORDER — PHENYLEPHRINE HCL-NACL 20-0.9 MG/250ML-% IV SOLN
INTRAVENOUS | Status: DC | PRN
  Administered 2024-04-21: 50 ug/min via INTRAVENOUS

## 2024-04-21 MED ORDER — LACTATED RINGERS IV BOLUS
500.0000 mL | Freq: Once | INTRAVENOUS | Status: AC
  Administered 2024-04-21: 500 mL via INTRAVENOUS

## 2024-04-21 MED ORDER — PROPOFOL 10 MG/ML IV BOLUS
INTRAVENOUS | Status: AC
  Filled 2024-04-21: qty 20

## 2024-04-21 MED ORDER — LACTATED RINGERS IV SOLN: INTRAVENOUS | Status: DC

## 2024-04-21 MED ORDER — HYDROMORPHONE HCL 1 MG/ML IJ SOLN
INTRAMUSCULAR | Status: AC
  Administered 2024-04-21: 0.25 mg via INTRAVENOUS
  Filled 2024-04-21: qty 1

## 2024-04-21 MED ORDER — ONDANSETRON HCL 4 MG/2ML IJ SOLN
INTRAMUSCULAR | Status: AC
  Filled 2024-04-21: qty 2

## 2024-04-21 MED ORDER — PROPOFOL 500 MG/50ML IV EMUL
INTRAVENOUS | Status: DC | PRN
Start: 2024-04-21 — End: 2024-04-21
  Administered 2024-04-21: 150 ug/kg/min via INTRAVENOUS
  Administered 2024-04-21: 100 ug/kg/min via INTRAVENOUS

## 2024-04-21 MED ORDER — CHLORHEXIDINE GLUCONATE 0.12 % MT SOLN
OROMUCOSAL | Status: AC
  Filled 2024-04-21: qty 15

## 2024-04-21 MED ORDER — AMISULPRIDE (ANTIEMETIC) 5 MG/2ML IV SOLN: 10.0000 mg | Freq: Once | INTRAVENOUS | Status: DC | PRN

## 2024-04-21 MED ORDER — PHENYLEPHRINE 80 MCG/ML (10ML) SYRINGE FOR IV PUSH (FOR BLOOD PRESSURE SUPPORT)
PREFILLED_SYRINGE | INTRAVENOUS | Status: AC
  Filled 2024-04-21: qty 10

## 2024-04-21 MED ORDER — SUGAMMADEX SODIUM 200 MG/2ML IV SOLN
INTRAVENOUS | Status: DC | PRN
  Administered 2024-04-21: 300 mg via INTRAVENOUS

## 2024-04-21 MED ORDER — VASOPRESSIN 20 UNIT/ML IV SOLN
INTRAVENOUS | Status: AC
  Filled 2024-04-21: qty 1

## 2024-04-21 MED ORDER — EPHEDRINE 5 MG/ML INJ
INTRAVENOUS | Status: AC
  Filled 2024-04-21: qty 5

## 2024-04-21 MED ORDER — SODIUM CHLORIDE (PF) 0.9 % IJ SOLN
INTRAMUSCULAR | Status: AC
  Filled 2024-04-21: qty 50

## 2024-04-21 MED ORDER — SUCCINYLCHOLINE CHLORIDE 200 MG/10ML IV SOSY
PREFILLED_SYRINGE | INTRAVENOUS | Status: AC
  Filled 2024-04-21: qty 10

## 2024-04-21 MED ORDER — ONDANSETRON HCL 4 MG/2ML IJ SOLN: 4.0000 mg | Freq: Four times a day (QID) | INTRAMUSCULAR | Status: DC | PRN

## 2024-04-21 MED ORDER — MORPHINE SULFATE (PF) 2 MG/ML IV SOLN
1.0000 mg | INTRAVENOUS | Status: DC | PRN
Start: 2024-04-21 — End: 2024-04-22

## 2024-04-21 MED ORDER — MIDAZOLAM HCL 2 MG/2ML IJ SOLN
INTRAMUSCULAR | Status: AC
  Filled 2024-04-21: qty 2

## 2024-04-21 MED ORDER — MIDAZOLAM HCL 2 MG/2ML IJ SOLN
INTRAMUSCULAR | Status: DC | PRN
  Administered 2024-04-21: 2 mg via INTRAVENOUS

## 2024-04-21 MED ORDER — OXYCODONE HCL 5 MG/5ML PO SOLN: 5.0000 mg | Freq: Once | ORAL | Status: DC | PRN

## 2024-04-21 MED ORDER — STERILE WATER FOR IRRIGATION IR SOLN
Status: DC | PRN
  Administered 2024-04-21: 1000 mL

## 2024-04-21 MED ORDER — FENTANYL CITRATE (PF) 250 MCG/5ML IJ SOLN
INTRAMUSCULAR | Status: AC
Start: 2024-04-21 — End: ?
  Filled 2024-04-21: qty 5

## 2024-04-21 MED ORDER — MENTHOL 3 MG MT LOZG: 1.0000 | LOZENGE | OROMUCOSAL | Status: DC | PRN

## 2024-04-21 MED ORDER — ONDANSETRON HCL 4 MG/2ML IJ SOLN: 4.0000 mg | Freq: Once | INTRAMUSCULAR | Status: DC | PRN

## 2024-04-21 MED ORDER — TRANEXAMIC ACID-NACL 1000-0.7 MG/100ML-% IV SOLN
INTRAVENOUS | Status: AC
  Filled 2024-04-21: qty 100

## 2024-04-21 MED ORDER — OXYCODONE HCL 5 MG PO TABS
5.0000 mg | ORAL_TABLET | Freq: Four times a day (QID) | ORAL | Status: DC | PRN
Start: 2024-04-21 — End: 2024-04-22

## 2024-04-21 MED ORDER — KETAMINE HCL 50 MG/5ML IJ SOSY
PREFILLED_SYRINGE | INTRAMUSCULAR | Status: DC | PRN
  Administered 2024-04-21: 20 mg via INTRAVENOUS

## 2024-04-21 MED ORDER — VASOPRESSIN 20 UNIT/ML IV SOLN
INTRAVENOUS | Status: DC | PRN
  Administered 2024-04-21: 30 mL via INTRAMUSCULAR

## 2024-04-21 MED ORDER — ONDANSETRON HCL 4 MG PO TABS
4.0000 mg | ORAL_TABLET | Freq: Four times a day (QID) | ORAL | Status: DC | PRN
Start: 2024-04-21 — End: 2024-04-22

## 2024-04-21 MED ORDER — PHENYLEPHRINE 80 MCG/ML (10ML) SYRINGE FOR IV PUSH (FOR BLOOD PRESSURE SUPPORT)
PREFILLED_SYRINGE | INTRAVENOUS | Status: DC | PRN
  Administered 2024-04-21: 80 ug via INTRAVENOUS

## 2024-04-21 MED ORDER — ACETAMINOPHEN 500 MG PO TABS
1000.0000 mg | ORAL_TABLET | Freq: Four times a day (QID) | ORAL | Status: DC
Start: 2024-04-21 — End: 2024-04-22
  Administered 2024-04-21 – 2024-04-22 (×4): 1000 mg via ORAL
  Filled 2024-04-21 (×4): qty 2

## 2024-04-21 MED ORDER — FENTANYL CITRATE (PF) 250 MCG/5ML IJ SOLN
INTRAMUSCULAR | Status: DC | PRN
  Administered 2024-04-21: 150 ug via INTRAVENOUS
  Administered 2024-04-21: 100 ug via INTRAVENOUS

## 2024-04-21 MED ORDER — PROPOFOL 10 MG/ML IV BOLUS
INTRAVENOUS | Status: DC | PRN
  Administered 2024-04-21 (×3): 20 mg via INTRAVENOUS
  Administered 2024-04-21: 200 mg via INTRAVENOUS

## 2024-04-21 MED ORDER — ROCURONIUM BROMIDE 10 MG/ML (PF) SYRINGE
PREFILLED_SYRINGE | INTRAVENOUS | Status: DC | PRN
  Administered 2024-04-21: 10 mg via INTRAVENOUS
  Administered 2024-04-21: 80 mg via INTRAVENOUS
  Administered 2024-04-21: 10 mg via INTRAVENOUS

## 2024-04-21 MED ORDER — DEXTROSE 5 % IV SOLN
5.0000 mg/kg | INTRAVENOUS | Status: AC
  Administered 2024-04-21: 420 mg via INTRAVENOUS
  Filled 2024-04-21: qty 10.5

## 2024-04-21 MED ORDER — MEPERIDINE HCL 25 MG/ML IJ SOLN: 6.2500 mg | INTRAMUSCULAR | Status: DC | PRN

## 2024-04-21 MED ORDER — SCOPOLAMINE 1 MG/3DAYS TD PT72
1.0000 | MEDICATED_PATCH | TRANSDERMAL | Status: DC
  Administered 2024-04-21: 1.5 mg via TRANSDERMAL

## 2024-04-21 MED ORDER — CLINDAMYCIN PHOSPHATE 900 MG/50ML IV SOLN
INTRAVENOUS | Status: AC
  Filled 2024-04-21: qty 50

## 2024-04-21 MED ORDER — 0.9 % SODIUM CHLORIDE (POUR BTL) OPTIME
TOPICAL | Status: DC | PRN
  Administered 2024-04-21: 2000 mL

## 2024-04-21 MED ORDER — ONDANSETRON HCL 4 MG/2ML IJ SOLN
INTRAMUSCULAR | Status: DC | PRN
  Administered 2024-04-21: 4 mg via INTRAVENOUS

## 2024-04-21 MED ORDER — ORAL CARE MOUTH RINSE: 15.0000 mL | Freq: Once | OROMUCOSAL | Status: AC

## 2024-04-21 MED ORDER — EPHEDRINE SULFATE-NACL 50-0.9 MG/10ML-% IV SOSY
PREFILLED_SYRINGE | INTRAVENOUS | Status: DC | PRN
  Administered 2024-04-21: 5 mg via INTRAVENOUS

## 2024-04-21 MED ORDER — IBUPROFEN 800 MG PO TABS
800.0000 mg | ORAL_TABLET | Freq: Three times a day (TID) | ORAL | Status: DC
  Administered 2024-04-21 – 2024-04-22 (×3): 800 mg via ORAL
  Filled 2024-04-21 (×3): qty 1

## 2024-04-21 MED ORDER — ACETAMINOPHEN 500 MG PO TABS
1000.0000 mg | ORAL_TABLET | Freq: Once | ORAL | Status: AC
  Administered 2024-04-21: 1000 mg via ORAL

## 2024-04-21 MED ORDER — LIDOCAINE 2% (20 MG/ML) 5 ML SYRINGE
INTRAMUSCULAR | Status: AC
  Filled 2024-04-21: qty 5

## 2024-04-21 MED ORDER — CHLORHEXIDINE GLUCONATE 0.12 % MT SOLN
15.0000 mL | Freq: Once | OROMUCOSAL | Status: AC
  Administered 2024-04-21: 15 mL via OROMUCOSAL

## 2024-04-21 MED ORDER — KETAMINE HCL 50 MG/5ML IJ SOSY
PREFILLED_SYRINGE | INTRAMUSCULAR | Status: AC
  Filled 2024-04-21: qty 5

## 2024-04-21 MED ORDER — SCOPOLAMINE 1 MG/3DAYS TD PT72
MEDICATED_PATCH | TRANSDERMAL | Status: AC
  Filled 2024-04-21: qty 1

## 2024-04-21 MED ORDER — OXYCODONE HCL 5 MG PO TABS: 5.0000 mg | ORAL_TABLET | Freq: Once | ORAL | Status: DC | PRN

## 2024-04-21 MED ORDER — CLINDAMYCIN PHOSPHATE 900 MG/50ML IV SOLN
900.0000 mg | INTRAVENOUS | Status: AC
Start: 2024-04-21 — End: 2024-04-21
  Administered 2024-04-21: 900 mg via INTRAVENOUS

## 2024-04-21 MED ORDER — HYDROMORPHONE HCL 1 MG/ML IJ SOLN
0.2500 mg | INTRAMUSCULAR | Status: DC | PRN
  Administered 2024-04-21 (×2): 0.25 mg via INTRAVENOUS

## 2024-04-21 MED ORDER — ROCURONIUM BROMIDE 10 MG/ML (PF) SYRINGE
PREFILLED_SYRINGE | INTRAVENOUS | Status: AC
  Filled 2024-04-21: qty 10

## 2024-04-21 SURGICAL SUPPLY — 49 items
BAG COUNTER SPONGE SURGICOUNT (BAG) ×2 IMPLANT
BENZOIN TINCTURE PRP APPL 2/3 (GAUZE/BANDAGES/DRESSINGS) IMPLANT
CLSR STERI-STRIP ANTIMIC 1/2X4 (GAUZE/BANDAGES/DRESSINGS) IMPLANT
COVER MAYO STAND STRL (DRAPES) IMPLANT
DRAPE CESAREAN BIRTH W POUCH (DRAPES) ×2 IMPLANT
DRAPE WARM FLUID 44X44 (DRAPES) ×2 IMPLANT
DRSG OPSITE POSTOP 4X10 (GAUZE/BANDAGES/DRESSINGS) ×2 IMPLANT
DRSG TEGADERM 4X4.75 (GAUZE/BANDAGES/DRESSINGS) IMPLANT
DURAPREP 26ML APPLICATOR (WOUND CARE) ×2 IMPLANT
ELECTRODE BLDE 4.0 EZ CLN MEGD (MISCELLANEOUS) IMPLANT
GAUZE 4X4 16PLY ~~LOC~~+RFID DBL (SPONGE) ×2 IMPLANT
GAUZE SPONGE 4X4 12PLY STRL (GAUZE/BANDAGES/DRESSINGS) IMPLANT
GLOVE BIO SURGEON STRL SZ 6.5 (GLOVE) ×2 IMPLANT
GLOVE BIOGEL PI IND STRL 6.5 (GLOVE) ×2 IMPLANT
GLOVE SURG ENC TEXT LTX SZ6.5 (GLOVE) ×2 IMPLANT
GLOVE SURG UNDER POLY LF SZ7 (GLOVE) ×4 IMPLANT
GOWN STRL REUS W/ TWL LRG LVL3 (GOWN DISPOSABLE) ×6 IMPLANT
HEMOSTAT ARISTA ABSORB 3G PWDR (HEMOSTASIS) IMPLANT
HIBICLENS CHG 4% 4OZ BTL (MISCELLANEOUS) ×2 IMPLANT
KIT TURNOVER KIT B (KITS) ×2 IMPLANT
LIGASURE IMPACT 36 18CM CVD LR (INSTRUMENTS) IMPLANT
MANIFOLD NEPTUNE II (INSTRUMENTS) ×2 IMPLANT
NDL HYPO 22X1.5 SAFETY MO (MISCELLANEOUS) IMPLANT
NEEDLE HYPO 22X1.5 SAFETY MO (MISCELLANEOUS) IMPLANT
PACK ABDOMINAL GYN (CUSTOM PROCEDURE TRAY) ×2 IMPLANT
PAD ARMBOARD POSITIONER FOAM (MISCELLANEOUS) ×2 IMPLANT
PAD OB MATERNITY 11 LF (PERSONAL CARE ITEMS) ×2 IMPLANT
PENCIL SMOKE EVACUATOR (MISCELLANEOUS) ×2 IMPLANT
RETRACTOR TRAXI PANNICULUS (MISCELLANEOUS) IMPLANT
RTRCTR C-SECT PINK 25CM LRG (MISCELLANEOUS) IMPLANT
SET Y ADAPTER MULIT-BAG IRRIG (MISCELLANEOUS) IMPLANT
SPONGE T-LAP 18X18 ~~LOC~~+RFID (SPONGE) ×4 IMPLANT
STRIP CLOSURE SKIN 1/2X4 (GAUZE/BANDAGES/DRESSINGS) IMPLANT
SUT MNCRL+ AB 3-0 CT1 36 (SUTURE) ×4 IMPLANT
SUT PDS AB 0 CT1 27 (SUTURE) ×4 IMPLANT
SUT VIC AB 0 CT1 27XBRD ANBCTR (SUTURE) IMPLANT
SUT VIC AB 0 CT1 27XCR 8 STRN (SUTURE) ×4 IMPLANT
SUT VIC AB 0 CT1 36 (SUTURE) ×2 IMPLANT
SUT VIC AB 2-0 CT1 (SUTURE) IMPLANT
SUT VIC AB 2-0 CT1 TAPERPNT 27 (SUTURE) ×2 IMPLANT
SUT VIC AB 2-0 SH 27XBRD (SUTURE) ×2 IMPLANT
SUT VIC AB 4-0 KS 27 (SUTURE) IMPLANT
SUT VICRYL 0 TIES 12 18 (SUTURE) ×2 IMPLANT
SYR 10ML LL (SYRINGE) IMPLANT
SYR 3ML 23GX1 SAFETY (SYRINGE) IMPLANT
SYR CONTROL 10ML LL (SYRINGE) IMPLANT
TOWEL GREEN STERILE FF (TOWEL DISPOSABLE) ×4 IMPLANT
TRAY FOLEY W/BAG SLVR 14FR (SET/KITS/TRAYS/PACK) ×2 IMPLANT
WATER STERILE IRR 1000ML POUR (IV SOLUTION) ×4 IMPLANT

## 2024-04-21 NOTE — Anesthesia Postprocedure Evaluation (Signed)
 Anesthesia Post Note  Patient: Erin Ross  Procedure(s) Performed: HYSTERECTOMY, TOTAL, ABDOMINAL, WITH SALPINGO-OOPHORECTOMY (Bilateral: Abdomen) CYSTOSCOPY (Bladder)     Patient location during evaluation: PACU Anesthesia Type: General Level of consciousness: awake and alert, oriented and patient cooperative Pain management: pain level controlled Vital Signs Assessment: post-procedure vital signs reviewed and stable Respiratory status: spontaneous breathing, nonlabored ventilation and respiratory function stable Cardiovascular status: blood pressure returned to baseline and stable Postop Assessment: no apparent nausea or vomiting Anesthetic complications: no Comments: Glidescope intubation   Encounter Notable Events  Notable Event Outcome Phase Comment  Difficult to intubate - expected  Intraprocedure Filed from anesthesia note documentation.    Last Vitals:  Vitals:   04/21/24 1130 04/21/24 1145  BP: (!) 103/50 (!) 92/48  Pulse: 67 63  Resp: 18 18  Temp:    SpO2: 100% 93%    Last Pain:  Vitals:   04/21/24 1145  TempSrc:   PainSc: 7                  Jacquelyne Matte

## 2024-04-21 NOTE — Progress Notes (Signed)
 GYN Post-Op Note  Subjective:  Patient is doing well. Pain is about 5/10 - just had Tylenol  and it is kicking in. Tolerating clears but is planning to order regular soon soon. Has not ambulated yet. Foley in place. Denies fevers, chills, chest pain, SOB, N/V, or bilateral LE edema.  Objective: BP 114/69 (BP Location: Left Arm)   Pulse 84   Temp 97.6 F (36.4 C) (Oral)   Resp 18   Ht 5\' 7"  (1.702 m)   Wt 116.6 kg   LMP 09/17/2017 Comment: no chance of pregnancy per patient  SpO2 98%   BMI 40.25 kg/m  Gen:  NAD, pleasant and cooperative Cardio:  Regular rate Pulm:  Normal work of breathing Abd:  Soft, non-distended, appropriately tender throughout, no rebound/guarding, abdominal binder in place, pressure dressing is clean/dry/intact Ext:  No bilateral LE edema, no bilateral calf tenderness, SCDs on an working  Foley: empty (just emptied)  UOP: 50-100cc/hour  A/P: POD#0 s/p TAH/BSO and Cystoscopy.  - Doing well post-operatively - Vitals reviewed - mildly hypotensive (90s-100s/48-50s), most recent BP 114/69 - Pulm:  RA - GI:  DAT  - Renal:  Foley in place - IVF:  LR at 125cc/hour, will give 500cc IVF bolus over an hour  - Pain:  Roxi/Motrin /Tylenol , Morphine  PRN - DVT Prophylaxis:  SCDs in bed - Activity:  Ambulate as tolerated - Labs:  CBC in AM  Will give 500cc IVF bolus over an hour in efforts to increase UOP. Most recent BP 114/69. Home BP medications held.  Tiaja Hagan, DO

## 2024-04-21 NOTE — Interval H&P Note (Signed)
 History and Physical Interval Note:  04/21/2024 7:26 AM  Erin Ross  has presented today for surgery, with the diagnosis of Fibroids.  The various methods of treatment have been discussed with the patient and family. After consideration of risks, benefits and other options for treatment, the patient has consented to  Procedure(s): HYSTERECTOMY, TOTAL, ABDOMINAL, WITH SALPINGO-OOPHORECTOMY (Bilateral) as a surgical intervention.  The patient's history has been reviewed, patient examined, no change in status, stable for surgery.  I have reviewed the patient's chart and labs.  Questions were answered to the patient's satisfaction.     Thao Vanover

## 2024-04-21 NOTE — Anesthesia Procedure Notes (Signed)
 Procedure Name: MAC Date/Time: 04/21/2024 7:44 AM  Performed by: Dawna Etienne, CRNAPre-anesthesia Checklist: Patient identified, Emergency Drugs available, Suction available, Patient being monitored and Timeout performed Patient Re-evaluated:Patient Re-evaluated prior to induction Oxygen Delivery Method: Circle system utilized and Simple face mask Preoxygenation: Pre-oxygenation with 100% oxygen Induction Type: IV induction Ventilation: Two handed mask ventilation required Laryngoscope Size: Glidescope and 3 Grade View: Grade I Tube type: Oral Tube size: 7.0 mm Number of attempts: 2 (Attempted DL with MAC 3, unable to view vc. Switched to glide, successful.) Airway Equipment and Method: Video-laryngoscopy Placement Confirmation: ETT inserted through vocal cords under direct vision, positive ETCO2 and breath sounds checked- equal and bilateral Secured at: 23 cm Tube secured with: Tape Dental Injury: Teeth and Oropharynx as per pre-operative assessment  Difficulty Due To: Difficulty was anticipated, Difficult Airway- due to large tongue and Difficult Airway- due to limited oral opening

## 2024-04-21 NOTE — Anesthesia Preprocedure Evaluation (Addendum)
 Anesthesia Evaluation  Patient identified by MRN, date of birth, ID band Patient awake    Reviewed: Allergy & Precautions, H&P , NPO status , Patient's Chart, lab work & pertinent test results  Airway Mallampati: IV  TM Distance: >3 FB Neck ROM: Full    Dental  (+) Teeth Intact, Dental Advisory Given   Pulmonary  Snores at night, negative sleep study about 2 years ago per pt    Pulmonary exam normal breath sounds clear to auscultation       Cardiovascular hypertension (171/93 preop, normally 130s SBP), Pt. on medications Normal cardiovascular exam Rhythm:Regular Rate:Normal     Neuro/Psych negative neurological ROS  negative psych ROS   GI/Hepatic Neg liver ROS,GERD  Controlled and Medicated,,  Endo/Other    Class 3 obesityBMI 40  Renal/GU negative Renal ROS  negative genitourinary   Musculoskeletal negative musculoskeletal ROS (+)    Abdominal  (+) + obese  Peds negative pediatric ROS (+)  Hematology negative hematology ROS (+) Hb 12.8, plt 321   Anesthesia Other Findings   Reproductive/Obstetrics PMB, uterine fibroids                              Anesthesia Physical Anesthesia Plan  ASA: 3  Anesthesia Plan: General   Post-op Pain Management: Tylenol  PO (pre-op)*, Toradol  IV (intra-op)*, Precedex, Ketamine  IV* and Dilaudid  IV   Induction: Intravenous  PONV Risk Score and Plan: 4 or greater and Ondansetron , Dexamethasone , Midazolam , Scopolamine  patch - Pre-op and Treatment may vary due to age or medical condition  Airway Management Planned: Oral ETT and Video Laryngoscope Planned  Additional Equipment: None  Intra-op Plan:   Post-operative Plan: Extubation in OR  Informed Consent: I have reviewed the patients History and Physical, chart, labs and discussed the procedure including the risks, benefits and alternatives for the proposed anesthesia with the patient or authorized  representative who has indicated his/her understanding and acceptance.     Dental advisory given  Plan Discussed with: CRNA  Anesthesia Plan Comments:         Anesthesia Quick Evaluation

## 2024-04-21 NOTE — Plan of Care (Signed)

## 2024-04-21 NOTE — Transfer of Care (Signed)
 Immediate Anesthesia Transfer of Care Note  Patient: Erin Ross  Procedure(s) Performed: HYSTERECTOMY, TOTAL, ABDOMINAL, WITH SALPINGO-OOPHORECTOMY (Bilateral: Abdomen) CYSTOSCOPY (Bladder)  Patient Location: PACU  Anesthesia Type:General  Level of Consciousness: awake, alert , patient cooperative, and responds to stimulation  Airway & Oxygen Therapy: Patient Spontanous Breathing and Patient connected to face mask oxygen  Post-op Assessment: Report given to RN, Post -op Vital signs reviewed and stable, and Patient moving all extremities X 4  Post vital signs: Reviewed and stable  Last Vitals:  Vitals Value Taken Time  BP 101/54 04/21/24 1037  Temp 36.6 C 04/21/24 1036  Pulse 77 04/21/24 1041  Resp 20 04/21/24 1041  SpO2 100 % 04/21/24 1041  Vitals shown include unfiled device data.  Last Pain:  Vitals:   04/21/24 6045  TempSrc: Oral      Patients Stated Pain Goal: 5 (04/21/24 4098)  Complications:  Encounter Notable Events  Notable Event Outcome Phase Comment  Difficult to intubate - expected  Intraprocedure Filed from anesthesia note documentation.

## 2024-04-21 NOTE — Op Note (Addendum)
 Pre Op Dx:   1. Fibroid uterus 2. History of postmenopausal bleeding  Post Op Dx:   Same as pre-operative diagnoses  Procedure:   1. Total Abdominal Hysterectomy and Bilateral Salpingo-oophorectomy 2. Cystoscopy  Surgeon:  Dr. August Blitz. Adrain Hopes Assistants:  Dr. Arlee Lace Anesthesia:  General  EBL:  50cc  IVF:  1200cc UOP:  100cc  Drains:  Foley catheter Specimen removed:  Uterus, cervix, bilateral fallopian tubes, and ovaries - sent to pathology Device(s) implanted:  None Case Type:  Clean-contaminated Findings: Enlarged fibroid uterus with a fundal pedunculated ~12cm fibroid. Normal-appearing bilateral fallopian tubes and ovaries. Omental adhesions to the fundus of the uterus and epiploic adhesions to the anterior portion of the fundus. Left fallopian tube with adhesion to the epiploic fat of the rectum. Adhesions between the bladder and anterior uterus. Right ureter visualized within the pelvis with peristalsis but unable to visualize the left ureter within the pelvis. On cystoscopy, bilateral ureteral jets were visualized and bladder appeared normal. Uterus weight approximately 931 grams. Complications: None Indications: 58 y.o. postmenopausal G0 with enlarging fibroid uterus and history of PMB who desires definitive surgical management.  Description of Procedure:  After informed consent was obtained, the patient was brought to the operating room.  Following administration of anesthesia, the patient was positioned in dorsal supine position and was prepped and draped in sterile fashion.  A Foley was inserted.  A preoperative time-out was performed. The abdomen was entered in layers through a pfannenstiel  incision. The adhesions as above were noted. The omental adhesions were taken down with electrosurgery. The epiploic adhesions were taken down sharply with Metzenbaum scissors. The epiploic fat made hemostatic with electrosurgery. An Alex retractor was placed and the uterus was brought  through the incision. After injecting 30cc of a dilute Vasopressin  solution at the base of the pedunculated fibroid, it was removed at the stalk with electrosurgery. Two figure-of-eight sutures were placed at the site of the stalk of the pedunculated fibroids for additional hemostasis. The right infundibulopelvic ligament was divided using the Ligasure device and the broad ligament was divided as well. The right round ligament was ligated, then divided with electrosurgery. This process was repeated on the contralateral side. The anterior vesicouterine peritoneum was divided to mobilize the bladder. The uterine artery and vein were skeletonized and then divided using the Ligasure device. This process was repeated on the left side. The uterus was then amputated. Straight clamps were used to isolate and divide pedicles along the cervix in an intrafascial technique.  At the level of the external cervical os, the specimen was divided from the vagina and removed from the field.  The cuff was closed with figure-of-eight sutures of 0-Vicryl. Hemostasis was obtained with electrosurgery and suture.  Irrigation was performed and hemostasis confirmed.  Arista hemostatic powder placed over the vaginal cuff and adnexa. Sponge counts were correct.  The peritoneum was closed in a running fashion with 2-0 Vicryl.  Subfascial spaces were inspected and hemostasis assured.  Remainder of Arista hemostatic powder placed over rectus muscle bellies. The fascia was closed in a running fashion with 0-PDS. The subcutaneous tissues were irrigated and hemostasis assured.  The subcutaneous tissues were closed with 3-0 Monocryl.  The skin was closed with 4-0 Vicryl.  A sterile bandage was applied.  The patient was placed in dorsal lithotomy position. Cystoscopy was performed and demonstrated intact urothelium throughout the bladder, a normal-appearing trigone and bilaterally patent ureteral orifices with normal urine jets noted.  The patient was  transferred to PACU.  All needle, sponge, and instrument counts were correct at the end of the case.    Disposition:  PACU  Comments: An experienced assistant was required given the standard of surgical care given the complexity of the case.  This assistant was needed for exposure, dissection, suctioning, retraction, instrument exchange, assisting with delivery with administration of fundal pressure, and for overall help during the procedure.

## 2024-04-21 NOTE — Progress Notes (Signed)
 Patient passing flatus; walking; urine output WNL. Pain well controlled.

## 2024-04-22 ENCOUNTER — Encounter (HOSPITAL_COMMUNITY): Payer: Self-pay | Admitting: Obstetrics and Gynecology

## 2024-04-22 ENCOUNTER — Other Ambulatory Visit (HOSPITAL_COMMUNITY): Payer: Self-pay

## 2024-04-22 LAB — CBC
HCT: 33.7 % — ABNORMAL LOW (ref 36.0–46.0)
Hemoglobin: 11.1 g/dL — ABNORMAL LOW (ref 12.0–15.0)
MCH: 28.1 pg (ref 26.0–34.0)
MCHC: 32.9 g/dL (ref 30.0–36.0)
MCV: 85.3 fL (ref 80.0–100.0)
Platelets: 310 10*3/uL (ref 150–400)
RBC: 3.95 MIL/uL (ref 3.87–5.11)
RDW: 13.2 % (ref 11.5–15.5)
WBC: 14.1 10*3/uL — ABNORMAL HIGH (ref 4.0–10.5)
nRBC: 0 % (ref 0.0–0.2)

## 2024-04-22 LAB — SURGICAL PATHOLOGY

## 2024-04-22 MED ORDER — OXYCODONE HCL 5 MG PO TABS
5.0000 mg | ORAL_TABLET | Freq: Four times a day (QID) | ORAL | 0 refills | Status: DC | PRN
  Filled 2024-04-22: qty 30, 4d supply, fill #0

## 2024-04-22 MED ORDER — IBUPROFEN 800 MG PO TABS
800.0000 mg | ORAL_TABLET | Freq: Three times a day (TID) | ORAL | 0 refills | Status: DC | PRN
  Filled 2024-04-22: qty 30, 10d supply, fill #0

## 2024-04-22 NOTE — Progress Notes (Addendum)
 GYN Post-Op Note  Subjective:  Patient is doing well. Pain is well-controlled. Tolerating regular diet. Has ambulated, passed flatus, and voided multiple times. Denies fevers, chills, chest pain, SOB, N/V, or bilateral LE edema.  Objective: BP (!) 131/58 (BP Location: Left Arm)   Pulse 71   Temp 97.8 F (36.6 C) (Oral)   Resp 16   Ht 5\' 7"  (1.702 m)   Wt 116.6 kg   LMP 09/17/2017 Comment: no chance of pregnancy per patient  SpO2 98%   BMI 40.25 kg/m  Gen:  NAD, pleasant and cooperative Cardio:  Regular rate Pulm:  Normal work of breathing Abd:  Soft, non-distended, appropriately tender throughout, no rebound/guarding, pressure dressing removed (honeycomb also removed as it was stuck to pressure dressing), steri strips in place with minimal drainage Ext:  No bilateral LE edema, no bilateral calf tenderness  UOP: 300-00cc/hour     Latest Ref Rng & Units 04/22/2024    4:25 AM 04/20/2024    9:30 AM 10/16/2022    8:36 AM  CBC  WBC 4.0 - 10.5 K/uL 14.1  5.2  5.3   Hemoglobin 12.0 - 15.0 g/dL 29.5  62.1  30.8   Hematocrit 36.0 - 46.0 % 33.7  38.9  39.3   Platelets 150 - 400 K/uL 310  321  336     A/P: POD#1 s/p TAH/BSO and Cystoscopy.  - Doing well post-operatively - Vitals reviewed - Pulm:  RA - GI: Regular diet - Renal:  AUOP, voiding spontaneously - IVF: SLIV - Pain:  Roxi/Motrin /Tylenol , Morphine  PRN - DVT Prophylaxis:  SCDs in bed - Activity:  Ambulate as tolerated - Primary RN to replace new honeycomb dressing - Labs:  CBC as above  Discharge instructions reviewed. Discussed adding back BP medications (Amlodipine tomorrow and then Lisinopril-hydrochlorothiazide on Friday).  Aminata Buffalo, DO

## 2024-04-22 NOTE — Plan of Care (Signed)

## 2024-04-22 NOTE — Discharge Summary (Signed)
 Physician Discharge Summary  Patient ID: Erin Ross MRN: [id number] DOB/AGE: December 15, 1966 58 y.o.  Admit date: 04/21/2024 Discharge date: 04/22/2024  Admission Diagnoses: Fibroid uterus History of Postmenopausal bleeding  Discharge Diagnoses:  Principal Problem:   Fibroid uterus History of postmenopausal bleeding  Procedure(s):  1. Total Abdominal Hysterectomy and Bilateral Salpingo-oophorectomy 2. Cystoscopy  Discharged Condition: good  Hospital Course: Patient was admitted on 04/21/2024 for the above named procedure(s) for the above named diagnoses. Prior to hospital discharge, patient was tolerating PO, ambulating, voiding spontaneously, passing flatus, and pain was well-controlled. See hospital chart for specific details. Patient was discharged home in stable condition.  Consults: None  Significant Diagnostic Studies:     Latest Ref Rng & Units 04/22/2024    4:25 AM 04/20/2024    9:30 AM 10/16/2022    8:36 AM  CBC  WBC 4.0 - 10.5 K/uL 14.1  5.2  5.3   Hemoglobin 12.0 - 15.0 g/dL 40.9  81.1  91.4   Hematocrit 36.0 - 46.0 % 33.7  38.9  39.3   Platelets 150 - 400 K/uL 310  321  336     Treatments: surgery: As above  Discharge Exam: Blood pressure (!) 140/62, pulse 73, temperature 97.8 F (36.6 C), temperature source Oral, resp. rate 17, height 5\' 7"  (1.702 m), weight 116.6 kg, last menstrual period 09/17/2017, SpO2 99%. Gen:  NAD, pleasant and cooperative Cardio:  Regular rate Pulm:  Normal work of breathing Abd:  Soft, non-distended, appropriately tender throughout, no rebound/guarding, pressure dressing removed (honeycomb also removed as it was stuck to pressure dressing), steri strips in place with minimal drainage Ext:  No bilateral LE edema, no bilateral calf tenderness  Disposition: Discharge disposition: 01-Home or Self Care       Discharge Instructions     Call MD for:   Complete by: As directed    Spotting to very light bleeding is normal as  the vaginal cuff is healing. Call with any heavy vaginal bleeding (filling up more than one large pad in an hour).   Call MD for:  difficulty breathing, headache or visual disturbances   Complete by: As directed    Call MD for:  extreme fatigue   Complete by: As directed    Call MD for:  hives   Complete by: As directed    Call MD for:  persistant dizziness or light-headedness   Complete by: As directed    Call MD for:  persistant nausea and vomiting   Complete by: As directed    Call MD for:  redness, tenderness, or signs of infection (pain, swelling, redness, odor or green/yellow discharge around incision site)   Complete by: As directed    Call MD for:  severe uncontrolled pain   Complete by: As directed    Call MD for:  temperature >100.4   Complete by: As directed    Diet - low sodium heart healthy   Complete by: As directed    Discharge wound care:   Complete by: As directed    Remove honeycomb dressing and steri strips in the shower in 5 days.   Driving Restrictions   Complete by: As directed    No driving for at least 2 weeks.   Increase activity slowly   Complete by: As directed    Lifting restrictions   Complete by: As directed    No lifting greater than 10lbs for 6 weeks.   Other Restrictions   Complete by: As directed  No baths or submersion in water  for at least 6 weeks.   Sexual Activity Restrictions   Complete by: As directed    No sexual intercourse or objects in the vagina for at least 6 weeks.      Allergies as of 04/22/2024       Reactions   Penicillins Hives        Medication List     TAKE these medications    acetaminophen  500 MG tablet Commonly known as: TYLENOL  Take 500 mg by mouth every 6 (six) hours as needed (pain.).   amLODipine 5 MG tablet Commonly known as: NORVASC Take 5 mg by mouth daily.   aspirin EC 81 MG tablet Take 81 mg by mouth in the morning.   cetirizine 10 MG tablet Commonly known as: ZYRTEC Take 10 mg by mouth  daily.   diclofenac 50 MG tablet Commonly known as: CATAFLAM Take 50 mg by mouth daily as needed (pain.).   Fish Oil 1200 MG Caps Take 1 capsule by mouth daily.   fluticasone 50 MCG/ACT nasal spray Commonly known as: FLONASE Place 1 spray into both nostrils daily as needed for allergies or rhinitis.   ibuprofen  200 MG tablet Commonly known as: ADVIL  Take 400 mg by mouth every 8 (eight) hours as needed (pain.). What changed: Another medication with the same name was added. Make sure you understand how and when to take each.   ibuprofen  800 MG tablet Commonly known as: ADVIL  Take 1 tablet (800 mg total) by mouth every 8 (eight) hours as needed for moderate pain (pain score 4-6) or cramping. What changed: You were already taking a medication with the same name, and this prescription was added. Make sure you understand how and when to take each.   lisinopril-hydrochlorothiazide 20-12.5 MG tablet Commonly known as: ZESTORETIC Take 1 tablet by mouth daily.   multivitamin with minerals Tabs tablet Take 1 tablet by mouth daily.   omeprazole 10 MG capsule Commonly known as: PRILOSEC Take 10 mg by mouth daily as needed.   oxyCODONE  5 MG immediate release tablet Commonly known as: Oxy IR/ROXICODONE  Take 1-2 tablets (5-10 mg total) by mouth every 6 (six) hours as needed for severe pain (pain score 7-10) or breakthrough pain.   Probiotic Daily Caps Take 1 capsule by mouth daily.   vitamin D3 25 MCG tablet Commonly known as: CHOLECALCIFEROL Take 1,000 Units by mouth every other day. In the morning               Discharge Care Instructions  (From admission, onward)           Start     Ordered   04/22/24 0000  Discharge wound care:       Comments: Remove honeycomb dressing and steri strips in the shower in 5 days.   04/22/24 9147            Follow-up Information     Meldon Sport, DO Follow up in 2 week(s).   Specialty: Obstetrics and Gynecology Why: Please  keep your 2 week and 6 week post-operative visits as previously scheduled. Contact information: [address] [place] [address] [phone]                 Signed: Meldon Sport 04/22/2024, 8:22 AM

## 2024-04-22 NOTE — Progress Notes (Signed)
   04/22/24 1310  Departure Condition  Departure Condition Good  Mobility at [employer]  Patient/Caregiver Teaching Teach Back Method Used;Discharge instructions reviewed;Prescriptions reviewed;Follow-up care reviewed;Patient/caregiver verbalized understanding;Medications discussed;Pain management discussed  Departure Mode With family  Was procedural sedation performed on this patient during this visit? Yes   Patient alert and oriented x4, VS and pain stable at discharge.

## 2024-05-14 ENCOUNTER — Other Ambulatory Visit: Payer: Self-pay | Admitting: Obstetrics and Gynecology

## 2024-05-14 ENCOUNTER — Encounter (HOSPITAL_COMMUNITY): Payer: Self-pay | Admitting: Obstetrics and Gynecology

## 2024-05-14 ENCOUNTER — Inpatient Hospital Stay (HOSPITAL_COMMUNITY)

## 2024-05-14 ENCOUNTER — Other Ambulatory Visit: Payer: Self-pay

## 2024-05-14 ENCOUNTER — Inpatient Hospital Stay (HOSPITAL_COMMUNITY)
Admission: AD | Admit: 2024-05-14 | Discharge: 2024-05-17 | DRG: 857 | Disposition: A | Discharge status: Home / Self Care | Attending: Obstetrics and Gynecology | Admitting: Obstetrics and Gynecology

## 2024-05-14 DIAGNOSIS — T8142XA Infection following a procedure, deep incisional surgical site, initial encounter: Secondary | ICD-10-CM | POA: Diagnosis not present

## 2024-05-14 DIAGNOSIS — T8141XA Infection following a procedure, superficial incisional surgical site, initial encounter: Secondary | ICD-10-CM | POA: Diagnosis present

## 2024-05-14 DIAGNOSIS — Z8249 Family history of ischemic heart disease and other diseases of the circulatory system: Secondary | ICD-10-CM

## 2024-05-14 DIAGNOSIS — Z6837 Body mass index (BMI) 37.0-37.9, adult: Secondary | ICD-10-CM | POA: Diagnosis not present

## 2024-05-14 DIAGNOSIS — Z880 Allergy status to penicillin: Secondary | ICD-10-CM

## 2024-05-14 DIAGNOSIS — T8149XA Infection following a procedure, other surgical site, initial encounter: Secondary | ICD-10-CM

## 2024-05-14 DIAGNOSIS — I10 Essential (primary) hypertension: Secondary | ICD-10-CM | POA: Diagnosis present

## 2024-05-14 DIAGNOSIS — L02211 Cutaneous abscess of abdominal wall: Secondary | ICD-10-CM | POA: Diagnosis present

## 2024-05-14 DIAGNOSIS — E66813 Obesity, class 3: Secondary | ICD-10-CM | POA: Diagnosis present

## 2024-05-14 DIAGNOSIS — Z833 Family history of diabetes mellitus: Secondary | ICD-10-CM | POA: Diagnosis not present

## 2024-05-14 LAB — BASIC METABOLIC PANEL WITH GFR
Anion gap: 13 (ref 5–15)
BUN: 20 mg/dL (ref 6–20)
CO2: 23 mmol/L (ref 22–32)
Calcium: 10.1 mg/dL (ref 8.9–10.3)
Chloride: 101 mmol/L (ref 98–111)
Creatinine, Ser: 1.11 mg/dL — ABNORMAL HIGH (ref 0.44–1.00)
GFR, Estimated: 58 mL/min — ABNORMAL LOW (ref >60)
Glucose, Bld: 123 mg/dL — ABNORMAL HIGH (ref 70–99)
Potassium: 4.3 mmol/L (ref 3.5–5.1)
Sodium: 137 mmol/L (ref 135–145)

## 2024-05-14 LAB — CBC WITH DIFFERENTIAL/PLATELET
Abs Immature Granulocytes: 0.03 10*3/uL (ref 0.00–0.07)
Basophils Absolute: 0.1 10*3/uL (ref 0.0–0.1)
Basophils Relative: 1 %
Eosinophils Absolute: 0.1 10*3/uL (ref 0.0–0.5)
Eosinophils Relative: 2 %
HCT: 35.6 % — ABNORMAL LOW (ref 36.0–46.0)
Hemoglobin: 11.7 g/dL — ABNORMAL LOW (ref 12.0–15.0)
Immature Granulocytes: 0 %
Lymphocytes Relative: 24 %
Lymphs Abs: 1.9 10*3/uL (ref 0.7–4.0)
MCH: 28.2 pg (ref 26.0–34.0)
MCHC: 32.9 g/dL (ref 30.0–36.0)
MCV: 85.8 fL (ref 80.0–100.0)
Monocytes Absolute: 0.5 10*3/uL (ref 0.1–1.0)
Monocytes Relative: 6 %
Neutro Abs: 5.2 10*3/uL (ref 1.7–7.7)
Neutrophils Relative %: 67 %
Platelets: 598 10*3/uL — ABNORMAL HIGH (ref 150–400)
RBC: 4.15 MIL/uL (ref 3.87–5.11)
RDW: 13.1 % (ref 11.5–15.5)
WBC: 7.8 10*3/uL (ref 4.0–10.5)
nRBC: 0 % (ref 0.0–0.2)

## 2024-05-14 LAB — C-REACTIVE PROTEIN: CRP: 0.6 mg/dL (ref <1.0)

## 2024-05-14 LAB — SEDIMENTATION RATE: Sed Rate: 34 mm/h — ABNORMAL HIGH (ref 0–22)

## 2024-05-14 MED ORDER — SODIUM CHLORIDE 0.9% FLUSH
3.0000 mL | Freq: Two times a day (BID) | INTRAVENOUS | Status: DC
  Administered 2024-05-14 – 2024-05-16 (×4): 3 mL via INTRAVENOUS

## 2024-05-14 MED ORDER — ZOLPIDEM TARTRATE 5 MG PO TABS: 5.0000 mg | ORAL_TABLET | Freq: Every evening | ORAL | Status: DC | PRN

## 2024-05-14 MED ORDER — HYDROMORPHONE HCL 1 MG/ML IJ SOLN
0.2000 mg | INTRAMUSCULAR | Status: DC | PRN
  Administered 2024-05-15 – 2024-05-16 (×2): 0.5 mg via INTRAVENOUS
  Filled 2024-05-14 (×2): qty 1

## 2024-05-14 MED ORDER — OXYCODONE-ACETAMINOPHEN 5-325 MG PO TABS: 1.0000 | ORAL_TABLET | ORAL | Status: DC | PRN

## 2024-05-14 MED ORDER — OXYCODONE HCL 5 MG PO TABS
5.0000 mg | ORAL_TABLET | Freq: Four times a day (QID) | ORAL | Status: DC | PRN
  Administered 2024-05-15 (×2): 5 mg via ORAL
  Administered 2024-05-16: 10 mg via ORAL
  Administered 2024-05-16 – 2024-05-17 (×3): 5 mg via ORAL
  Filled 2024-05-14 (×5): qty 1
  Filled 2024-05-14: qty 2

## 2024-05-14 MED ORDER — METRONIDAZOLE 500 MG/100ML IV SOLN
500.0000 mg | Freq: Two times a day (BID) | INTRAVENOUS | Status: DC
  Administered 2024-05-14 – 2024-05-17 (×5): 500 mg via INTRAVENOUS
  Filled 2024-05-14 (×5): qty 100

## 2024-05-14 MED ORDER — DOCUSATE SODIUM 100 MG PO CAPS
100.0000 mg | ORAL_CAPSULE | Freq: Two times a day (BID) | ORAL | Status: DC
  Administered 2024-05-14 – 2024-05-15 (×3): 100 mg via ORAL
  Filled 2024-05-14 (×6): qty 1

## 2024-05-14 MED ORDER — IOHEXOL 350 MG/ML SOLN
50.0000 mL | Freq: Once | INTRAVENOUS | Status: AC | PRN
  Administered 2024-05-14: 50 mL via INTRAVENOUS

## 2024-05-14 MED ORDER — AMLODIPINE BESYLATE 5 MG PO TABS
5.0000 mg | ORAL_TABLET | Freq: Every day | ORAL | Status: DC
  Administered 2024-05-15 – 2024-05-16 (×2): 5 mg via ORAL
  Filled 2024-05-14 (×3): qty 1

## 2024-05-14 MED ORDER — SODIUM CHLORIDE 0.9 % IV SOLN
2.0000 g | INTRAVENOUS | Status: AC
  Administered 2024-05-15: 2 g via INTRAVENOUS
  Filled 2024-05-14: qty 2

## 2024-05-14 MED ORDER — LISINOPRIL-HYDROCHLOROTHIAZIDE 20-12.5 MG PO TABS: 1.0000 | ORAL_TABLET | Freq: Every day | ORAL | Status: DC

## 2024-05-14 MED ORDER — ALUM & MAG HYDROXIDE-SIMETH 200-200-20 MG/5ML PO SUSP
30.0000 mL | ORAL | Status: DC | PRN
  Administered 2024-05-15: 30 mL via ORAL
  Filled 2024-05-14: qty 30

## 2024-05-14 MED ORDER — CLINDAMYCIN PHOSPHATE 900 MG/50ML IV SOLN: 900.0000 mg | Freq: Three times a day (TID) | INTRAVENOUS | Status: DC

## 2024-05-14 MED ORDER — POVIDONE-IODINE 10 % EX SWAB
2.0000 | Freq: Once | CUTANEOUS | Status: AC
  Administered 2024-05-15: 2 via TOPICAL

## 2024-05-14 MED ORDER — ONDANSETRON HCL 4 MG PO TABS: 4.0000 mg | ORAL_TABLET | Freq: Four times a day (QID) | ORAL | Status: DC | PRN

## 2024-05-14 MED ORDER — SIMETHICONE 80 MG PO CHEW
80.0000 mg | CHEWABLE_TABLET | Freq: Four times a day (QID) | ORAL | Status: DC | PRN
  Administered 2024-05-15: 80 mg via ORAL
  Filled 2024-05-14: qty 1

## 2024-05-14 MED ORDER — SODIUM CHLORIDE 0.9 % IV SOLN: 250.0000 mL | INTRAVENOUS | Status: AC | PRN

## 2024-05-14 MED ORDER — SODIUM CHLORIDE 0.9 % IV SOLN
2.0000 g | INTRAVENOUS | Status: DC
  Administered 2024-05-14 – 2024-05-16 (×3): 2 g via INTRAVENOUS
  Filled 2024-05-14 (×3): qty 20

## 2024-05-14 MED ORDER — LISINOPRIL 10 MG PO TABS
20.0000 mg | ORAL_TABLET | Freq: Every day | ORAL | Status: DC
  Administered 2024-05-15 – 2024-05-17 (×3): 20 mg via ORAL
  Filled 2024-05-14 (×3): qty 2

## 2024-05-14 MED ORDER — IBUPROFEN 800 MG PO TABS
800.0000 mg | ORAL_TABLET | Freq: Three times a day (TID) | ORAL | Status: DC | PRN
Start: 2024-05-14 — End: 2024-05-15

## 2024-05-14 MED ORDER — SODIUM CHLORIDE 0.9% FLUSH: 3.0000 mL | INTRAVENOUS | Status: DC | PRN

## 2024-05-14 MED ORDER — HYDROCHLOROTHIAZIDE 12.5 MG PO TABS
12.5000 mg | ORAL_TABLET | Freq: Every day | ORAL | Status: DC
  Administered 2024-05-15 – 2024-05-17 (×3): 12.5 mg via ORAL
  Filled 2024-05-14 (×4): qty 1

## 2024-05-14 MED ORDER — ONDANSETRON HCL 4 MG/2ML IJ SOLN
4.0000 mg | Freq: Four times a day (QID) | INTRAMUSCULAR | Status: DC | PRN
Start: 2024-05-14 — End: 2024-05-18

## 2024-05-14 MED ORDER — LACTATED RINGERS IV SOLN: INTRAVENOUS | Status: DC

## 2024-05-14 NOTE — H&P (Signed)
 Subjective:    Chief Complaint(s): Incision check/ Wound infection     HPI:      General 58 year old established female patient that is 3 weeks post op from Total abdomenal hysterectomy/ BSO on 04/21/24 for fibroid and postmenopausal bleeding managment with Dr. Adrain Hopes Pt is being treated for post op wound infection x past 14 days with Bactrim DS. Pt last seen by Dr. Adrain Hopes on 05/11/24 for recheck of wound. Pt reports an increase in wound discharge in amount and smell and states that area feels hardened and more full. Denies temp, nausea, vomiting or abdomenal pain.       Current Medication:     Taking Acetaminophen  Extra Strength 500 MG Tablet 2 tablets as needed Orally every 6 hrs for pain alternating with ibuprofen . amLODIPine Besylate 5 MG Tablet 1 tablet Orally Once a day. Aspirin 81 MG Tablet Delayed Release 1 tablet Orally Once a day. Centrum Ultra Womens(Multiple Vitamins-Minerals) Tablet 1 tablet Orally once a day. Fish Oil 1000 MG Capsule 1 capsule Orally Once a day. Fluticasone Propionate 50 MCG/DOSE Suspension 1 spray in each nostril Nasally Once a day. Ibuprofen  800 MG Tablet 1 tablet with food or milk as needed Orally every 8 hrs for moderate pain and cramping. Lisinopril-hydroCHLOROthiazide 20-12.5 MG Tablet 1 tablet Orally Once a day. Omeprazole 10 MG Capsule Delayed Release 1 capsule 1/2 to 1 hour before morning meal Orally Once a day as needed. oxyCODONE  HCl 5 MG Tablet 1-2 tablets as needed Orally every 6 hrs for severe pain Pain scale(7-10 or breakthrough pain. Probiotic - Tablet Delayed Release as directed Orally. Sulfamethoxazole-Trimethoprim 800-160 MG Tablet 1 tablet Orally twice a day. Vitamin D 50 MCG (2000 UT) Tablet 1 tablet Orally Once a day. ZyrTEC Allergy(Cetirizine HCl) 10 MG Tablet 1 tablet Orally Once a day. Medication List reviewed and reconciled with the patient.     Medical History: HTN Prediabetes OSA (sleep study done through [facility] sleep 2022)  mild Obesity: associated with HTN/OSA/prediabetes Seasonal Allergies (takes antihistamine year round but uses NS just as add on seasonally)     Allergies/Intolerance: Penicillin: Allergy - childhood- unsure     Gyn History: Sexual activity not currently sexually active.  Periods : hysterectomy.  LMP 09/2020, PMB 03/26/2022.  Denies Birth control.  Last pap smear date 04/26/2022- NILM.  Last mammogram date 12/22/2023.  Denies Abnormal pap smear.  Denies STD.  Menarche 13.  GYN procedures Total abdominal hysterectomy with BSO 04/23/ 2025 with Dr. Adrain Hopes .     OB History: Never been pregnant  per patient.      Surgical History: Right leg as an infant Colonoscopy 03/2018 Hysteroscopy, D&C 10/17/2022 Total  abdominal Hysterectomy with BSO 04/21/2024      Family History: Father: alive 92 yrs, diagnosed with Hypertension Mother: alive 43 yrs, high cholesterol, diagnosed with Hypertension Paternal Grand Father: deceased Paternal Grand Mother: deceased Maternal Grand Father: deceased, HTN Maternal Grand Mother: deceased, MS Paternal aunt: colon CA Maternal uncle: several with HTN, CAD, stroke Maternal aunt: AODM She is the only child Maternal Great Aunt-Deceased. Hx of Colon Cancer.     Social History:      General Tobacco use:   cigarettes:  Never smoked, Tobacco history last updated  05/14/2024, Vaping  No.  EXPOSURE TO PASSIVE SMOKE:  no.   Alcohol:  no.   Caffeine:  yes diet gengerale some times rarely, chocolate. Recreational drug use:  no.   Exercise:  yes walking or eliptical 30 minutes 3 times a  week. Marital Status:  single.   Children:  none.   OCCUPATION:  employed [employer]. Lives alone.     ROS: negative except hpi noted.   Objective:    Vitals: Wt: 240.4, Wt change: -4.2 lbs, Ht: 65.5, BMI: 39.39, Temp: 98.8, Pulse sitting: 78, BP sitting: 117/73.    Past Results:    Examination:      General Examination CONSTITUTIONAL: alert, oriented, NAD , alert,  oriented, NAD , alert, oriented, NAD.  SKIN: moist, warm , normal, no rash , normal, no rash.  EYES: Conjunctiva clear.  LUNGS: good I:E efffort noted.  ABDOMEN: !) exam by NP;    no masses palpated, induration noted the lower abdomen surrounding incision, pfannenstiel incision with a 3cm erythematous separation in the right lateral aspect  and another ~3cm cm superficial draining erythematous opening noted  mid incision with purulent foul smelling draininage, mild tenderness is present  2) Dr. Wynona Hedger in for exam , no masses palpated,  MD probed the incision openings with sterile Qtip and noted pfannenstiel incision with a 3cm separation in the right lateral aspect that tracks ~7-8 cm when probed (fascia intact), and another ~3cm cm superficial draining opening noted mid incision with tracing noted 7-8cm when probed with foul smelling purulent drainage.Aaron Aas  EXTREMITIES: no edema present , no edema present.  PSYCH: affect normal, good eye contact.     Physical Examination:   Assessment:    Assessment: Postoperative wound infection - T81.49XA (Primary)    Wound abscess  Hypertension   Plan: Admit to [facility]  CT scan of the abdomen to rule out fascial dehiscence.  IV Rocephin and Flagyl  Wound Culture  NPO after midnight  for Wound exploration and debridement in AM Hypertension -  continue lisionpril /hydrochlorothiazide/ amolodopine

## 2024-05-15 ENCOUNTER — Inpatient Hospital Stay (HOSPITAL_COMMUNITY): Admitting: Anesthesiology

## 2024-05-15 ENCOUNTER — Encounter (HOSPITAL_COMMUNITY): Payer: Self-pay | Admitting: Obstetrics and Gynecology

## 2024-05-15 ENCOUNTER — Encounter (HOSPITAL_COMMUNITY)
Admission: AD | Disposition: A | Discharge status: Home / Self Care | Payer: Self-pay | Source: Home / Self Care | Attending: Obstetrics and Gynecology

## 2024-05-15 ENCOUNTER — Other Ambulatory Visit: Payer: Self-pay

## 2024-05-15 DIAGNOSIS — T8142XA Infection following a procedure, deep incisional surgical site, initial encounter: Secondary | ICD-10-CM

## 2024-05-15 HISTORY — PX: WOUND EXPLORATION: SHX6188

## 2024-05-15 HISTORY — PX: IRRIGATION AND DEBRIDEMENT HEMATOMA: SHX5254

## 2024-05-15 LAB — GLUCOSE, CAPILLARY
Glucose-Capillary: 103 mg/dL — ABNORMAL HIGH (ref 70–99)
Glucose-Capillary: 146 mg/dL — ABNORMAL HIGH (ref 70–99)
Glucose-Capillary: 132 mg/dL — ABNORMAL HIGH (ref 70–99)

## 2024-05-15 LAB — TYPE AND SCREEN
ABO/RH(D): O POS
Antibody Screen: NEGATIVE

## 2024-05-15 SURGERY — WOUND EXPLORATION
Anesthesia: General | Site: Abdomen

## 2024-05-15 MED ORDER — 0.9 % SODIUM CHLORIDE (POUR BTL) OPTIME
TOPICAL | Status: DC | PRN
Start: 2024-05-15 — End: 2024-05-15
  Administered 2024-05-15: 2000 mL

## 2024-05-15 MED ORDER — ROCURONIUM BROMIDE 10 MG/ML (PF) SYRINGE
PREFILLED_SYRINGE | INTRAVENOUS | Status: AC
  Filled 2024-05-15: qty 30

## 2024-05-15 MED ORDER — MIDAZOLAM HCL 2 MG/2ML IJ SOLN
INTRAMUSCULAR | Status: AC
  Filled 2024-05-15: qty 2

## 2024-05-15 MED ORDER — FENTANYL CITRATE (PF) 100 MCG/2ML IJ SOLN
INTRAMUSCULAR | Status: AC
  Filled 2024-05-15: qty 2

## 2024-05-15 MED ORDER — DEXAMETHASONE SODIUM PHOSPHATE 10 MG/ML IJ SOLN
INTRAMUSCULAR | Status: DC | PRN
  Administered 2024-05-15: 10 mg via INTRAVENOUS

## 2024-05-15 MED ORDER — PROPOFOL 10 MG/ML IV BOLUS
INTRAVENOUS | Status: AC
  Filled 2024-05-15: qty 20

## 2024-05-15 MED ORDER — GLYCOPYRROLATE PF 0.2 MG/ML IJ SOSY
PREFILLED_SYRINGE | INTRAMUSCULAR | Status: DC | PRN
Start: 2024-05-15 — End: 2024-05-15
  Administered 2024-05-15: .1 mg via INTRAVENOUS

## 2024-05-15 MED ORDER — IBUPROFEN 800 MG PO TABS
800.0000 mg | ORAL_TABLET | Freq: Three times a day (TID) | ORAL | Status: DC | PRN
  Administered 2024-05-15 – 2024-05-17 (×5): 800 mg via ORAL
  Filled 2024-05-15 (×5): qty 1

## 2024-05-15 MED ORDER — CHLORHEXIDINE GLUCONATE 0.12 % MT SOLN
OROMUCOSAL | Status: AC
  Filled 2024-05-15: qty 15

## 2024-05-15 MED ORDER — ONDANSETRON HCL 4 MG/2ML IJ SOLN
INTRAMUSCULAR | Status: DC | PRN
  Administered 2024-05-15: 4 mg via INTRAVENOUS

## 2024-05-15 MED ORDER — ORAL CARE MOUTH RINSE: 15.0000 mL | Freq: Once | OROMUCOSAL | Status: AC

## 2024-05-15 MED ORDER — PHENYLEPHRINE HCL-NACL 20-0.9 MG/250ML-% IV SOLN
INTRAVENOUS | Status: DC | PRN
  Administered 2024-05-15: 50 ug/min via INTRAVENOUS

## 2024-05-15 MED ORDER — INSULIN ASPART 100 UNIT/ML IJ SOLN
0.0000 [IU] | INTRAMUSCULAR | Status: DC
  Administered 2024-05-15 – 2024-05-16 (×4): 2 [IU] via SUBCUTANEOUS

## 2024-05-15 MED ORDER — SCOPOLAMINE 1 MG/3DAYS TD PT72
1.0000 | MEDICATED_PATCH | TRANSDERMAL | Status: DC
  Administered 2024-05-15: 1.5 mg via TRANSDERMAL
  Filled 2024-05-15: qty 1

## 2024-05-15 MED ORDER — LIDOCAINE 2% (20 MG/ML) 5 ML SYRINGE
INTRAMUSCULAR | Status: DC | PRN
  Administered 2024-05-15: 60 mg via INTRAVENOUS

## 2024-05-15 MED ORDER — EPHEDRINE 5 MG/ML INJ
INTRAVENOUS | Status: AC
  Filled 2024-05-15: qty 5

## 2024-05-15 MED ORDER — ONDANSETRON HCL 4 MG/2ML IJ SOLN
INTRAMUSCULAR | Status: AC
  Filled 2024-05-15: qty 2

## 2024-05-15 MED ORDER — EPHEDRINE SULFATE-NACL 50-0.9 MG/10ML-% IV SOSY
PREFILLED_SYRINGE | INTRAVENOUS | Status: DC | PRN
  Administered 2024-05-15 (×2): 10 mg via INTRAVENOUS

## 2024-05-15 MED ORDER — ROCURONIUM BROMIDE 10 MG/ML (PF) SYRINGE
PREFILLED_SYRINGE | INTRAVENOUS | Status: DC | PRN
  Administered 2024-05-15 (×2): 50 mg via INTRAVENOUS

## 2024-05-15 MED ORDER — OXYCODONE HCL 5 MG PO TABS: 5.0000 mg | ORAL_TABLET | Freq: Once | ORAL | Status: DC | PRN

## 2024-05-15 MED ORDER — PROPOFOL 1000 MG/100ML IV EMUL
INTRAVENOUS | Status: AC
  Filled 2024-05-15: qty 100

## 2024-05-15 MED ORDER — CHLORHEXIDINE GLUCONATE 0.12 % MT SOLN
15.0000 mL | Freq: Once | OROMUCOSAL | Status: AC
  Administered 2024-05-15: 15 mL via OROMUCOSAL

## 2024-05-15 MED ORDER — SUCCINYLCHOLINE CHLORIDE 200 MG/10ML IV SOSY
PREFILLED_SYRINGE | INTRAVENOUS | Status: AC
  Filled 2024-05-15: qty 10

## 2024-05-15 MED ORDER — LIDOCAINE 2% (20 MG/ML) 5 ML SYRINGE
INTRAMUSCULAR | Status: AC
  Filled 2024-05-15: qty 5

## 2024-05-15 MED ORDER — FENTANYL CITRATE (PF) 100 MCG/2ML IJ SOLN
25.0000 ug | INTRAMUSCULAR | Status: DC | PRN
  Administered 2024-05-15: 25 ug via INTRAVENOUS

## 2024-05-15 MED ORDER — PROPOFOL 500 MG/50ML IV EMUL
INTRAVENOUS | Status: DC | PRN
  Administered 2024-05-15: 150 ug/kg/min via INTRAVENOUS

## 2024-05-15 MED ORDER — ONDANSETRON HCL 4 MG/2ML IJ SOLN
4.0000 mg | Freq: Four times a day (QID) | INTRAMUSCULAR | Status: AC | PRN
  Administered 2024-05-15: 4 mg via INTRAVENOUS

## 2024-05-15 MED ORDER — ACETAMINOPHEN 10 MG/ML IV SOLN
INTRAVENOUS | Status: DC | PRN
  Administered 2024-05-15: 1000 mg via INTRAVENOUS

## 2024-05-15 MED ORDER — DEXAMETHASONE SODIUM PHOSPHATE 10 MG/ML IJ SOLN
INTRAMUSCULAR | Status: AC
  Filled 2024-05-15: qty 1

## 2024-05-15 MED ORDER — FENTANYL CITRATE (PF) 250 MCG/5ML IJ SOLN
INTRAMUSCULAR | Status: AC
  Filled 2024-05-15: qty 5

## 2024-05-15 MED ORDER — KETOROLAC TROMETHAMINE 30 MG/ML IJ SOLN
INTRAMUSCULAR | Status: AC
  Filled 2024-05-15: qty 1

## 2024-05-15 MED ORDER — KETOROLAC TROMETHAMINE 30 MG/ML IJ SOLN
INTRAMUSCULAR | Status: DC | PRN
Start: 2024-05-15 — End: 2024-05-15
  Administered 2024-05-15: 30 mg via INTRAVENOUS

## 2024-05-15 MED ORDER — SUGAMMADEX SODIUM 200 MG/2ML IV SOLN
INTRAVENOUS | Status: DC | PRN
  Administered 2024-05-15: 400 mg via INTRAVENOUS
  Administered 2024-05-15: 100 mg via INTRAVENOUS

## 2024-05-15 MED ORDER — FENTANYL CITRATE (PF) 250 MCG/5ML IJ SOLN
INTRAMUSCULAR | Status: DC | PRN
  Administered 2024-05-15: 50 ug via INTRAVENOUS
  Administered 2024-05-15: 100 ug via INTRAVENOUS

## 2024-05-15 MED ORDER — PHENYLEPHRINE 80 MCG/ML (10ML) SYRINGE FOR IV PUSH (FOR BLOOD PRESSURE SUPPORT)
PREFILLED_SYRINGE | INTRAVENOUS | Status: DC | PRN
  Administered 2024-05-15 (×4): 160 ug via INTRAVENOUS

## 2024-05-15 MED ORDER — MIDAZOLAM HCL 2 MG/2ML IJ SOLN
INTRAMUSCULAR | Status: DC | PRN
  Administered 2024-05-15: 2 mg via INTRAVENOUS

## 2024-05-15 MED ORDER — LACTATED RINGERS IV SOLN: INTRAVENOUS | Status: DC

## 2024-05-15 MED ORDER — PROPOFOL 10 MG/ML IV BOLUS
INTRAVENOUS | Status: DC | PRN
  Administered 2024-05-15: 20 mg via INTRAVENOUS
  Administered 2024-05-15: 150 mg via INTRAVENOUS

## 2024-05-15 MED ORDER — ALBUTEROL SULFATE HFA 108 (90 BASE) MCG/ACT IN AERS
INHALATION_SPRAY | RESPIRATORY_TRACT | Status: DC | PRN
  Administered 2024-05-15: 4 via RESPIRATORY_TRACT

## 2024-05-15 MED ORDER — OXYCODONE HCL 5 MG/5ML PO SOLN: 5.0000 mg | Freq: Once | ORAL | Status: DC | PRN

## 2024-05-15 MED ORDER — PHENYLEPHRINE 80 MCG/ML (10ML) SYRINGE FOR IV PUSH (FOR BLOOD PRESSURE SUPPORT)
PREFILLED_SYRINGE | INTRAVENOUS | Status: AC
  Filled 2024-05-15: qty 10

## 2024-05-15 MED ORDER — KETAMINE HCL 50 MG/5ML IJ SOSY
PREFILLED_SYRINGE | INTRAMUSCULAR | Status: AC
  Filled 2024-05-15: qty 5

## 2024-05-15 MED ORDER — ALBUMIN HUMAN 5 % IV SOLN: INTRAVENOUS | Status: DC | PRN

## 2024-05-15 MED ORDER — ACETAMINOPHEN 10 MG/ML IV SOLN
INTRAVENOUS | Status: AC
  Filled 2024-05-15: qty 100

## 2024-05-15 SURGICAL SUPPLY — 42 items
BAG COUNTER SPONGE SURGICOUNT (BAG) ×1 IMPLANT
BENZOIN TINCTURE PRP APPL 2/3 (GAUZE/BANDAGES/DRESSINGS) IMPLANT
BNDG GAUZE DERMACEA FLUFF 4 (GAUZE/BANDAGES/DRESSINGS) IMPLANT
COVER MAYO STAND STRL (DRAPES) ×1 IMPLANT
DRAPE CESAREAN BIRTH W POUCH (DRAPES) ×2 IMPLANT
DRAPE WARM FLUID 44X44 (DRAPES) IMPLANT
DRSG OPSITE POSTOP 4X10 (GAUZE/BANDAGES/DRESSINGS) ×1 IMPLANT
DURAPREP 26ML APPLICATOR (WOUND CARE) ×1 IMPLANT
GAUZE 4X4 16PLY ~~LOC~~+RFID DBL (SPONGE) ×1 IMPLANT
GAUZE PAD ABD 8X10 STRL (GAUZE/BANDAGES/DRESSINGS) IMPLANT
GAUZE SPONGE 4X4 12PLY STRL (GAUZE/BANDAGES/DRESSINGS) IMPLANT
GLOVE BIOGEL M 7.0 STRL (GLOVE) ×1 IMPLANT
GLOVE BIOGEL PI IND STRL 7.0 (GLOVE) ×3 IMPLANT
GOWN STRL REUS W/ TWL LRG LVL3 (GOWN DISPOSABLE) ×3 IMPLANT
HEMOSTAT ARISTA ABSORB 3G PWDR (HEMOSTASIS) IMPLANT
HIBICLENS CHG 4% 4OZ BTL (MISCELLANEOUS) ×2 IMPLANT
KIT TURNOVER KIT B (KITS) ×1 IMPLANT
MANIFOLD NEPTUNE II (INSTRUMENTS) ×1 IMPLANT
NDL HYPO 22X1.5 SAFETY MO (MISCELLANEOUS) IMPLANT
NEEDLE HYPO 22X1.5 SAFETY MO (MISCELLANEOUS) IMPLANT
NS IRRIG 1000ML POUR BTL (IV SOLUTION) ×1 IMPLANT
PACK ABDOMINAL GYN (CUSTOM PROCEDURE TRAY) ×1 IMPLANT
PAD ARMBOARD POSITIONER FOAM (MISCELLANEOUS) ×1 IMPLANT
PAD OB MATERNITY 11 LF (PERSONAL CARE ITEMS) ×1 IMPLANT
PENCIL SMOKE EVACUATOR (MISCELLANEOUS) ×1 IMPLANT
SPECIMEN JAR MEDIUM (MISCELLANEOUS) ×1 IMPLANT
SPIKE FLUID TRANSFER (MISCELLANEOUS) IMPLANT
SPONGE T-LAP 18X18 ~~LOC~~+RFID (SPONGE) ×2 IMPLANT
STAPLER VISISTAT 35W (STAPLE) IMPLANT
STRIP CLOSURE SKIN 1/2X4 (GAUZE/BANDAGES/DRESSINGS) IMPLANT
SUT PDS AB 0 CT1 27 (SUTURE) ×2 IMPLANT
SUT VIC AB 0 CT1 27XCR 8 STRN (SUTURE) ×2 IMPLANT
SUT VIC AB 0 CT1 36 (SUTURE) ×1 IMPLANT
SUT VIC AB 2-0 CT1 (SUTURE) IMPLANT
SUT VIC AB 2-0 CT1 TAPERPNT 27 (SUTURE) ×1 IMPLANT
SUT VIC AB 2-0 SH 27XBRD (SUTURE) ×1 IMPLANT
SUT VIC AB 4-0 KS 27 (SUTURE) IMPLANT
SUT VICRYL 0 TIES 12 18 (SUTURE) ×1 IMPLANT
SYR CONTROL 10ML LL (SYRINGE) IMPLANT
TAPE CLOTH SURG 4X10 WHT LF (GAUZE/BANDAGES/DRESSINGS) IMPLANT
TOWEL GREEN STERILE FF (TOWEL DISPOSABLE) ×2 IMPLANT
TRAY FOLEY W/BAG SLVR 14FR (SET/KITS/TRAYS/PACK) ×1 IMPLANT

## 2024-05-15 NOTE — Anesthesia Postprocedure Evaluation (Signed)
 Anesthesia Post Note  Patient: Erin Ross  Procedure(s) Performed: WOUND EXPLORATION (Abdomen) IRRIGATION AND DEBRIDEMENT HEMATOMA (Abdomen)     Patient location during evaluation: PACU Anesthesia Type: General Level of consciousness: awake and alert Pain management: pain level controlled Vital Signs Assessment: post-procedure vital signs reviewed and stable Respiratory status: spontaneous breathing, nonlabored ventilation, respiratory function stable and patient connected to nasal cannula oxygen Cardiovascular status: blood pressure returned to baseline and stable Postop Assessment: no apparent nausea or vomiting Anesthetic complications: no   No notable events documented.  Last Vitals:  Vitals:   05/15/24 1045 05/15/24 1144  BP:  (!) 108/52  Pulse:  77  Resp:  17  Temp:  36.4 C  SpO2: 96% 99%    Last Pain:  Vitals:   05/15/24 1144  TempSrc: Axillary  PainSc:                  Odesser Tourangeau S

## 2024-05-15 NOTE — H&P (Signed)
 Date of Initial H&P: 05/14/2024  History reviewed, patient examined, no change in status, stable for surgery.

## 2024-05-15 NOTE — Anesthesia Preprocedure Evaluation (Signed)
 Anesthesia Evaluation  Patient identified by MRN, date of birth, ID band Patient awake    Reviewed: Allergy & Precautions, H&P , NPO status , Patient's Chart, lab work & pertinent test results  Airway Mallampati: II   Neck ROM: full    Dental   Pulmonary neg pulmonary ROS   breath sounds clear to auscultation       Cardiovascular hypertension,  Rhythm:regular Rate:Normal     Neuro/Psych    GI/Hepatic ,GERD  ,,  Endo/Other    Class 3 obesity  Renal/GU      Musculoskeletal   Abdominal   Peds  Hematology   Anesthesia Other Findings   Reproductive/Obstetrics                             Anesthesia Physical Anesthesia Plan  ASA: 2  Anesthesia Plan: General   Post-op Pain Management:    Induction: Intravenous  PONV Risk Score and Plan: 3 and Ondansetron , Dexamethasone , Midazolam  and Treatment may vary due to age or medical condition  Airway Management Planned: Oral ETT and Video Laryngoscope Planned  Additional Equipment:   Intra-op Plan:   Post-operative Plan: Extubation in OR  Informed Consent: I have reviewed the patients History and Physical, chart, labs and discussed the procedure including the risks, benefits and alternatives for the proposed anesthesia with the patient or authorized representative who has indicated his/her understanding and acceptance.     Dental advisory given  Plan Discussed with: CRNA, Anesthesiologist and Surgeon  Anesthesia Plan Comments:        Anesthesia Quick Evaluation

## 2024-05-15 NOTE — Progress Notes (Signed)
 Patient ambulated independently to bathroom and foley catheter removed. Pain is well controlled. Patient ambulated to chair and is eating and drinking well. No nausea at this time.

## 2024-05-15 NOTE — Op Note (Signed)
 05/14/2024 - 05/15/2024  8:36 AM  PATIENT:  Erin Ross  58 y.o. female  PRE-OPERATIVE DIAGNOSIS:  Wound infection/ abscess  POST-OPERATIVE DIAGNOSIS:  Wound infection/ Abscess   PROCEDURE:  Wound Exploration/Wound debridement of abscess  SURGEON:  Surgeons and Role:    Arlee Lace, MD - Primary    * Autry-Lott, Jeneane Miracle, DO - Assisting  PHYSICIAN ASSISTANT:   ASSISTANTS: Autry-Lott Simone DO assisted An experienced assistant was required given the standard of surgical care given the complexity of the case.  This assistant was needed for exposure, dissection, suctioning, retraction, instrument exchange, and for overall help during the procedure.    ANESTHESIA:   general  EBL:  20 mL   BLOOD ADMINISTERED:none  DRAINS: Urinary Catheter (Foley)   LOCAL MEDICATIONS USED:  NONE  SPECIMEN:  No Specimen  DISPOSITION OF SPECIMEN:  N/A  COUNTS:  YES  TOURNIQUET:  * No tourniquets in log *  DICTATION: .Note written in EPIC  PLAN OF CARE: Admit to inpatient   PATIENT DISPOSITION:  PACU - hemodynamically stable.   Delay start of Pharmacological VTE agent (>24 hrs) due to surgical blood loss or risk of bleeding: not applicable  Findings: 3 cm defect in the skin on the right lateral portion of the incision   another 2 cm defect approximately 4 cm from the defect on the right lateral portion of the incision. Both defects with purulent drainage. Depth of the defect tunneled 8 cm under the skin from the right lateral edge to the left side of the incision. The defect was approximately 4 cm deep.  Once the incision was opened further purulence was noted along with substantial necrotic tissue along the length of the incision superiorly and inferiorly. The fascia was intact.   Procedure: The patient was taken to the operating room where she was placed under general anesthesia. She was placed in the  supine position. Time out was performed  Foley catheter was placed. She was prepped  and draped in the usual sterile fashion.  The incision was examined with the findings noted above. The incision was opened from the right lateral defect  to the left aspect of the incision with removal of necrotic tissue using scalpel. The necrotic tissue was removed until healthy vascularized tissue was noted. The incision was packed with a sterile Gause moistened with normal saline.   Patient was awakened from anesthesia. She was taken to the recovery room awake and in stable condition.

## 2024-05-15 NOTE — Progress Notes (Signed)
 Wet to dry dressing gauze roll in normal saline; 4X4s, ABD pads, and paper tape. Wound measurements Length 15 cm, Height 2 cm, and depth of 5 cm medially with tunneling on right and left. Wound wet with active bleeding around 50 cc. Pressure applied and hemostasis noted. Packing achieved and dressing applied. Patient tolerated well and was pre-medicated. Patient is passing gas, walking, and has voided. Vital signs stable.

## 2024-05-15 NOTE — Transfer of Care (Signed)
 Immediate Anesthesia Transfer of Care Note  Patient: Erin Ross  Procedure(s) Performed: WOUND EXPLORATION (Abdomen) IRRIGATION AND DEBRIDEMENT HEMATOMA (Abdomen)  Patient Location: PACU  Anesthesia Type:General  Level of Consciousness: awake, alert , and drowsy  Airway & Oxygen Therapy: Patient Spontanous Breathing and Patient connected to face mask oxygen  Post-op Assessment: Report given to RN, Post -op Vital signs reviewed and stable, and Patient moving all extremities X 4  Post vital signs: Reviewed and stable  Last Vitals:  Vitals Value Taken Time  BP 113/82 05/15/24 0852  Temp    Pulse 98 05/15/24 0853  Resp 18 05/15/24 0855  SpO2 100 % 05/15/24 0853  Vitals shown include unfiled device data.  Last Pain:  Vitals:   05/15/24 0527  TempSrc: Oral  PainSc:          Complications: No notable events documented.

## 2024-05-16 ENCOUNTER — Encounter (HOSPITAL_COMMUNITY): Payer: Self-pay | Admitting: Obstetrics and Gynecology

## 2024-05-16 LAB — BASIC METABOLIC PANEL WITH GFR
Anion gap: 9 (ref 5–15)
BUN: 16 mg/dL (ref 6–20)
CO2: 25 mmol/L (ref 22–32)
Calcium: 9.6 mg/dL (ref 8.9–10.3)
Chloride: 104 mmol/L (ref 98–111)
Creatinine, Ser: 0.92 mg/dL (ref 0.44–1.00)
GFR, Estimated: >60 mL/min
Glucose, Bld: 108 mg/dL — ABNORMAL HIGH (ref 70–99)
Potassium: 4.6 mmol/L (ref 3.5–5.1)
Sodium: 138 mmol/L (ref 135–145)

## 2024-05-16 LAB — GLUCOSE, CAPILLARY
Glucose-Capillary: 107 mg/dL — ABNORMAL HIGH (ref 70–99)
Glucose-Capillary: 116 mg/dL — ABNORMAL HIGH (ref 70–99)
Glucose-Capillary: 121 mg/dL — ABNORMAL HIGH (ref 70–99)
Glucose-Capillary: 129 mg/dL — ABNORMAL HIGH (ref 70–99)
Glucose-Capillary: 79 mg/dL (ref 70–99)
Glucose-Capillary: 94 mg/dL (ref 70–99)

## 2024-05-16 LAB — CBC WITH DIFFERENTIAL/PLATELET
Abs Immature Granulocytes: 0.06 10*3/uL (ref 0.00–0.07)
Basophils Absolute: 0 10*3/uL (ref 0.0–0.1)
Basophils Relative: 0 %
Eosinophils Absolute: 0 10*3/uL (ref 0.0–0.5)
Eosinophils Relative: 0 %
HCT: 31.5 % — ABNORMAL LOW (ref 36.0–46.0)
Hemoglobin: 10.7 g/dL — ABNORMAL LOW (ref 12.0–15.0)
Immature Granulocytes: 1 %
Lymphocytes Relative: 13 %
Lymphs Abs: 1.5 10*3/uL (ref 0.7–4.0)
MCH: 28.7 pg (ref 26.0–34.0)
MCHC: 34 g/dL (ref 30.0–36.0)
MCV: 84.5 fL (ref 80.0–100.0)
Monocytes Absolute: 0.6 10*3/uL (ref 0.1–1.0)
Monocytes Relative: 5 %
Neutro Abs: 9.6 10*3/uL — ABNORMAL HIGH (ref 1.7–7.7)
Neutrophils Relative %: 81 %
Platelets: 561 10*3/uL — ABNORMAL HIGH (ref 150–400)
RBC: 3.73 MIL/uL — ABNORMAL LOW (ref 3.87–5.11)
RDW: 13.4 % (ref 11.5–15.5)
WBC: 11.7 10*3/uL — ABNORMAL HIGH (ref 4.0–10.5)
nRBC: 0 % (ref 0.0–0.2)

## 2024-05-16 NOTE — Plan of Care (Signed)

## 2024-05-16 NOTE — Progress Notes (Signed)
 GYN COMPREHENSIVE PROGRESS NOTE  Erin Ross is a 58 y.o. patient that is 3 weeks post op from Total abdomenal hysterectomy/ BSO on 04/21/24 for fibroid and postmenopausal bleeding managment with Dr. Adrain Hopes. Now s/p wound exploration and debridement of abscess.   Length of Stay:  2 Days. Admitted 05/14/2024  Subjective: She states she is feeling better continues to have some left abdominal discomfort at the site of the wound but endorsing improvement with pain medications.   Vitals:  Blood pressure (!) 116/56, pulse 74, temperature 98 F (36.7 C), temperature source Oral, resp. rate 17, height 5\' 7"  (1.702 m), weight 111 kg, last menstrual period 09/17/2017, SpO2 100%. Physical Examination: General: Appears well, no acute distress. Age appropriate. Cardiac: regular rate noted. Respiratory: normal effort Abdomen: soft, nontender, nondistended Extremities: No LE edema or cyanosis. Negative signs of DVT Incision: Warm and dry/clean dressing. RN stated presence of granulation tissue Neuro: alert and oriented, no focal deficits Psych: normal affect  Results for orders placed or performed during the hospital encounter of 05/14/24 (from the past 48 hours)  Sedimentation rate     Status: Abnormal   Collection Time: 05/14/24  2:57 PM  Result Value Ref Range   Sed Rate 34 (H) 0 - 22 mm/hr    Comment: Performed at [facility], [address] [place], [place] [address]  CBC with Differential/Platelet     Status: Abnormal   Collection Time: 05/14/24  2:57 PM  Result Value Ref Range   WBC 7.8 4.0 - 10.5 K/uL   RBC 4.15 3.87 - 5.11 MIL/uL   Hemoglobin 11.7 (L) 12.0 - 15.0 g/dL   HCT 60.4 (L) 54.0 - 98.1 %   MCV 85.8 80.0 - 100.0 fL   MCH 28.2 26.0 - 34.0 pg   MCHC 32.9 30.0 - 36.0 g/dL   RDW 19.1 47.8 - 29.5 %   Platelets 598 (H) 150 - 400 K/uL   nRBC 0.0 0.0 - 0.2 %   Neutrophils Relative % 67 %   Neutro Abs 5.2 1.7 - 7.7 K/uL   Lymphocytes Relative 24 %   Lymphs Abs 1.9  0.7 - 4.0 K/uL   Monocytes Relative 6 %   Monocytes Absolute 0.5 0.1 - 1.0 K/uL   Eosinophils Relative 2 %   Eosinophils Absolute 0.1 0.0 - 0.5 K/uL   Basophils Relative 1 %   Basophils Absolute 0.1 0.0 - 0.1 K/uL   Immature Granulocytes 0 %   Abs Immature Granulocytes 0.03 0.00 - 0.07 K/uL    Comment: Performed at [facility], [address] [place], [place] [address]  C-reactive protein     Status: None   Collection Time: 05/14/24  2:57 PM  Result Value Ref Range   CRP 0.6 <1.0 mg/dL    Comment: Performed at [facility], [address], [place], [place] [address]  Basic metabolic panel     Status: Abnormal   Collection Time: 05/14/24  2:57 PM  Result Value Ref Range   Sodium 137 135 - 145 mmol/L   Potassium 4.3 3.5 - 5.1 mmol/L   Chloride 101 98 - 111 mmol/L   CO2 23 22 - 32 mmol/L   Glucose, Bld 123 (H) 70 - 99 mg/dL    Comment: Glucose reference range applies only to samples taken after fasting for at least 8 hours.   BUN 20 6 - 20 mg/dL   Creatinine, Ser 4.69 (H) 0.44 - 1.00 mg/dL   Calcium  10.1 8.9 - 10.3 mg/dL   GFR, Estimated 58 (L) >60 mL/min    Comment: (NOTE) Calculated using the CKD-EPI Creatinine Equation (2021)    Anion gap 13 5 - 15    Comment: Performed at [facility], [address] [place], [place] [address]  Aerobic/Anaerobic Culture w Gram Stain (surgical/deep wound)     Status: None (Preliminary result)   Collection Time: 05/14/24  6:27 PM   Specimen: Wound  Result Value Ref Range   Specimen Description WOUND    Special Requests ABD    Gram Stain      RARE WBC PRESENT, PREDOMINANTLY PMN NO ORGANISMS SEEN    Culture      NO GROWTH < 12 HOURS Performed at [facility], [address], [place], [place] [address]    Report Status PENDING   Type and screen [facility]     Status: None   Collection Time: 05/15/24  4:55 AM  Result Value Ref Range   ABO/RH(D) O POS    Antibody Screen NEG    Sample  Expiration      05/18/2024,2359 Performed at [facility], [address], [place], [place] [address]   Glucose, capillary     Status: Abnormal   Collection Time: 05/15/24 11:56 AM  Result Value Ref Range   Glucose-Capillary 103 (H) 70 - 99 mg/dL    Comment: Glucose reference range applies only to samples taken after fasting for at least 8 hours.  Glucose, capillary     Status: Abnormal   Collection Time: 05/15/24  3:52 PM  Result Value Ref Range   Glucose-Capillary 146 (H) 70 - 99 mg/dL    Comment: Glucose reference range applies only to samples taken after fasting for at least 8 hours.  Glucose, capillary     Status: Abnormal   Collection Time: 05/15/24  8:14 PM  Result Value Ref Range   Glucose-Capillary 132 (H) 70 - 99 mg/dL    Comment: Glucose reference range applies only to samples taken after fasting for at least 8 hours.  Glucose, capillary     Status: Abnormal   Collection Time: 05/16/24 12:31 AM  Result Value Ref Range   Glucose-Capillary 121 (H) 70 - 99 mg/dL    Comment: Glucose reference range applies only to samples taken after fasting for at least 8 hours.  Glucose, capillary     Status: Abnormal   Collection Time: 05/16/24  4:06 AM  Result Value Ref Range   Glucose-Capillary 129 (H) 70 - 99 mg/dL    Comment: Glucose reference range applies only to samples taken after fasting for at least 8 hours.  CBC with Differential/Platelet     Status: Abnormal   Collection Time: 05/16/24  4:18 AM  Result Value Ref Range   WBC 11.7 (H) 4.0 - 10.5 K/uL   RBC 3.73 (L) 3.87 - 5.11 MIL/uL   Hemoglobin 10.7 (L) 12.0 - 15.0 g/dL   HCT 91.4 (L) 78.2 - 95.6 %   MCV 84.5 80.0 - 100.0 fL   MCH 28.7 26.0 - 34.0 pg   MCHC 34.0 30.0 - 36.0 g/dL   RDW 21.3 08.6 - 57.8 %   Platelets 561 (H) 150 - 400 K/uL   nRBC 0.0 0.0 - 0.2 %   Neutrophils Relative % 81 %   Neutro Abs 9.6 (H) 1.7 - 7.7 K/uL   Lymphocytes Relative 13 %   Lymphs Abs 1.5 0.7 - 4.0 K/uL  Monocytes Relative 5 %    Monocytes Absolute 0.6 0.1 - 1.0 K/uL   Eosinophils Relative 0 %   Eosinophils Absolute 0.0 0.0 - 0.5 K/uL   Basophils Relative 0 %   Basophils Absolute 0.0 0.0 - 0.1 K/uL   Immature Granulocytes 1 %   Abs Immature Granulocytes 0.06 0.00 - 0.07 K/uL    Comment: Performed at [facility], [address] [place], [place] [address]  Basic metabolic panel with GFR     Status: Abnormal   Collection Time: 05/16/24  6:22 AM  Result Value Ref Range   Sodium 138 135 - 145 mmol/L   Potassium 4.6 3.5 - 5.1 mmol/L   Chloride 104 98 - 111 mmol/L   CO2 25 22 - 32 mmol/L   Glucose, Bld 108 (H) 70 - 99 mg/dL    Comment: Glucose reference range applies only to samples taken after fasting for at least 8 hours.   BUN 16 6 - 20 mg/dL   Creatinine, Ser 2.13 0.44 - 1.00 mg/dL   Calcium 9.6 8.9 - 08.6 mg/dL   GFR, Estimated >57 >84 mL/min    Comment: (NOTE) Calculated using the CKD-EPI Creatinine Equation (2021)    Anion gap 9 5 - 15    Comment: Performed at [facility], [address] [place], [place] [address]    CT ABDOMEN PELVIS W CONTRAST Result Date: 05/14/2024 CLINICAL DATA:  Abdominal abscess. Evaluate for fascial dehiscence/deep wound infection. EXAM: CT ABDOMEN AND PELVIS WITH CONTRAST TECHNIQUE: Multidetector CT imaging of the abdomen and pelvis was performed using the standard protocol following bolus administration of intravenous contrast. RADIATION DOSE REDUCTION: This exam was performed according to the departmental dose-optimization program which includes automated exposure control, adjustment of the mA and/or kV according to patient size and/or use of iterative reconstruction technique. CONTRAST:  50mL OMNIPAQUE  IOHEXOL  350 MG/ML SOLN COMPARISON:  None Available. FINDINGS: Lower chest: No acute abnormality. Hepatobiliary: No focal liver abnormality is seen. No gallstones, gallbladder wall thickening, or biliary dilatation. Pancreas: Unremarkable. No pancreatic ductal  dilatation or surrounding inflammatory changes. Spleen: Normal in size without focal abnormality. Adrenals/Urinary Tract: Adrenal glands are unremarkable. Kidneys are normal, without renal calculi, focal lesion, or hydronephrosis. Bladder is unremarkable. Stomach/Bowel: Stomach is within normal limits. Appendix appears normal. No evidence of bowel wall thickening, distention, or inflammatory changes. There is diffuse colonic diverticulosis. Vascular/Lymphatic: No significant vascular findings are present. No enlarged abdominal or pelvic lymph nodes. Reproductive: Status post hysterectomy. There is a lobulated soft tissue density in the left adnexa measuring 2.9 by 1.9 by 3.5 cm. No right adnexal mass. Other: There is trace free fluid in the pelvis. Lower transverse abdominal wall wound is present. There is a small amount of air and stranding. There is a very thin fluid collection in the deep soft tissues of this region measuring 6.5 by 0.9 by 1.7 cm. This collection does not contain air. Musculoskeletal: No acute or significant osseous findings. IMPRESSION: 1. Lower transverse abdominal wall wound with a small amount of air and stranding. There is a very thin fluid collection in the deep soft tissues of this region measuring 6.5 x 0.9 x 1.7 cm, indeterminate. 2. Trace free fluid in the pelvis. 3. Colonic diverticulosis. 4. Lobulated soft tissue density in the left adnexa measuring 3.5 cm. Findings may relate dated to residual ovarian tissue in the appropriate clinical setting. Please correlate with surgical history. Consider further evaluation with pelvic ultrasound. Electronically Signed   By:  Tyron Gallon M.D.   On: 05/14/2024 19:54    Current scheduled medications  amLODipine   5 mg Oral Daily   docusate sodium   100 mg Oral BID   lisinopril   20 mg Oral Daily   And   hydrochlorothiazide   12.5 mg Oral Daily   insulin aspart  0-24 Units Subcutaneous Q4H   scopolamine   1 patch Transdermal Q72H   sodium  chloride flush  3 mL Intravenous Q12H    I have reviewed the patient's current medications.  ASSESSMENT: Principal Problem:   Wound infection after surgery   PLAN: -Continue IV antibiotics. Transition to PO 5/18 or if wound culture returns with susceptibilities.  -F/u wound cx -Coordinate obtaining open wound vac with anticipation to go home once successfully transitioned to oral antibiotics -Continue home medications for HTN   Thana Filter, DO Family Medicine [facility] Obstetrics [facility] 05/16/2024, 7:10 AM

## 2024-05-16 NOTE — TOC Initial Note (Addendum)
 Transition of Care [facility]) - Initial/Assessment Note    Patient Details  Name: Erin Ross MRN: [id number] Date of Birth: 01/25/1966  Transition [facility]) CM/SW Contact:    Omie Bickers, RN Phone Number: 05/16/2024, 10:20 AM  Clinical Narrative:                  Patient will need wound VAC and HH RN for DC.   VAC: Requested attending to place DME VAC order and note with wound(s) size.   Scanned demographics, DME order, and op note to Traci w [facility].  Needs: Note w measurements, once placed, scanned to [email or web address] The wound VAC will need insurance auth through [facility] once all information is sent to [place].   HH: Patient has commercial [employer]. Informed by [facility] liaison that she would have a copay. If she were able to go the Wound Care Center it could possibly be covered.  Lennart Quitter would have availability if patient is agreeable to copay ([facility] checking on cost of copay, and will have that info available Monday)  Wound Care Center will open Monday for follow up about scheduling there.   Expected Discharge Plan: Home w Home Health Services Barriers to Discharge: Continued Medical Work up   Patient Goals and CMS Choice            Expected Discharge Plan and Services                                              Prior Living Arrangements/Services                       Activities of Daily Living   ADL Screening (condition at time of admission) Independently performs ADLs?: Yes (appropriate for developmental age) Is the patient deaf or have difficulty hearing?: No Does the patient have difficulty seeing, even when wearing glasses/contacts?: Yes Does the patient have difficulty concentrating, remembering, or making decisions?: No  Permission Sought/Granted                  Emotional Assessment              Admission diagnosis:  Wound infection after surgery [T81.49XA] Patient Active Problem List   Diagnosis Date Noted   Wound  infection after surgery 05/14/2024   Fibroid uterus 04/21/2024   Uterine leiomyoma 04/03/2024   Pure hypercholesterolemia 04/03/2024   Perennial allergic rhinitis 04/03/2024   Lightheaded 09/19/2022   Postmenopausal bleeding 08/07/2022   Overweight or obesity 03/03/2014   PCP:  Diamond Formica, PA Pharmacy:   [id number], [place] - [address] [facility] [address] [place] [address] Phone: [phone] Fax: [phone]  [facility] [address] [place] [address] Phone: [phone] Fax: [phone]     Social Drivers of Health (SDOH) Social History: SDOH Screenings   Food Insecurity: No Food Insecurity (05/14/2024)  Housing: Low Risk  (05/14/2024)  Transportation Needs: No Transportation Needs (05/14/2024)  Utilities: Not At Risk (05/14/2024)  Tobacco Use: Low Risk  (05/15/2024)   SDOH Interventions:     Readmission Risk Interventions     No data to display

## 2024-05-16 NOTE — Progress Notes (Addendum)
 Wet to dry sterile dressing change. Wound measurements L:15cm, W:2 cm, Depth: 5 cm medially with tunneling of 4 cm right laterally. Minimal blood noted and pink granulation tissue evident. No pus or foul smell. Dressing packed with rolled gauze moistened with saline. Externally 4x4s, ABD pads, and paper tape. Patient tolerated dressing change well stating more pain on right side during packing. No pain post dressing change.

## 2024-05-17 ENCOUNTER — Other Ambulatory Visit (HOSPITAL_COMMUNITY): Payer: Self-pay

## 2024-05-17 LAB — GLUCOSE, CAPILLARY
Glucose-Capillary: 105 mg/dL — ABNORMAL HIGH (ref 70–99)
Glucose-Capillary: 86 mg/dL (ref 70–99)

## 2024-05-17 MED ORDER — AMLODIPINE BESYLATE 5 MG PO TABS
5.0000 mg | ORAL_TABLET | Freq: Every day | ORAL | Status: DC
  Administered 2024-05-17: 5 mg via ORAL

## 2024-05-17 MED ORDER — OXYCODONE HCL 5 MG PO TABS
5.0000 mg | ORAL_TABLET | Freq: Four times a day (QID) | ORAL | 0 refills | Status: AC | PRN
Start: 2024-05-17 — End: ?
  Filled 2024-05-17: qty 15, 4d supply, fill #0

## 2024-05-17 MED ORDER — AMOXICILLIN-POT CLAVULANATE 875-125 MG PO TABS
1.0000 | ORAL_TABLET | Freq: Two times a day (BID) | ORAL | 0 refills | Status: AC
  Filled 2024-05-17: qty 20, 10d supply, fill #0

## 2024-05-17 MED ORDER — AMOXICILLIN-POT CLAVULANATE 875-125 MG PO TABS
1.0000 | ORAL_TABLET | Freq: Two times a day (BID) | ORAL | Status: DC
  Administered 2024-05-17: 1 via ORAL
  Filled 2024-05-17: qty 1

## 2024-05-17 MED ORDER — IBUPROFEN 800 MG PO TABS
800.0000 mg | ORAL_TABLET | Freq: Three times a day (TID) | ORAL | 0 refills | Status: AC | PRN
  Filled 2024-05-17: qty 30, 10d supply, fill #0

## 2024-05-17 NOTE — Progress Notes (Signed)
 2 Days Post-Op Procedure(s) (LRB): WOUND EXPLORATION (N/A) IRRIGATION AND DEBRIDEMENT HEMATOMA (N/A)  Subjective: Patient reports incision pain 2 out of 10 currentlyy. 7 out of 10 during dressing changes. Feels groggy after 10 mg of oxycodone  that was given prior to dressing change last night. She is tolerating po however has decreased appetite this morning.     Objective: I have reviewed patient's vital signs, medications, labs, and microbiology.  General: alert, cooperative, and no distress Resp: no distress GI: soft nontender nondistended no rebound no guarding  Extremities: extremities normal, atraumatic, no cyanosis or edema Inicision : Bandage is in place / clean dry and intact.   Results for orders placed or performed during the hospital encounter of 05/14/24 (from the past 24 hours)  Glucose, capillary     Status: None   Collection Time: 05/16/24  7:56 PM  Result Value Ref Range   Glucose-Capillary 79 70 - 99 mg/dL  Glucose, capillary     Status: Abnormal   Collection Time: 05/17/24 12:07 AM  Result Value Ref Range   Glucose-Capillary 105 (H) 70 - 99 mg/dL  Glucose, capillary     Status: None   Collection Time: 05/17/24  4:16 AM  Result Value Ref Range   Glucose-Capillary 86 70 - 99 mg/dL    Assessment: s/p Procedure(s): WOUND EXPLORATION (N/A) IRRIGATION AND DEBRIDEMENT HEMATOMA (N/A): stable  Plan: Discontinue IV Antibiotics - switch to po for 14 days of total therapy will start augmentin  875mg  bid  Consult for Wound vac with HH  to change vac 2 times a week.  Possible discharge home this afternoon.   LOS: 3 days    Erin Lace, MD 05/17/2024, 10:14 AM

## 2024-05-17 NOTE — TOC Progression Note (Signed)
 Transition of Care [facility]) - Progression Note    Patient Details  Name: Erin Ross MRN: [id number] Date of Birth: [birth year]  Transition of Care [facility]) CM/SW Contact  Thirza Fleet, RN Phone Number:[phone] 05/17/2024, 11:21 AM  Clinical Narrative:     CM met with patient this am and reviewed demographics and they are correct in system. Patient lives alone and does work and drive but when patient is discharged patient's mother plans to stay with her for 3 weeks and drive her to any appointments and help with her at home.  CM discussed home health with patient for dressing changes and patient in agreement with [facility] and they are in contract with [employer], patient's insurance company. There is a $30 co pay for each visit and explained this to patient per Amy- liaison with [facility] and patient verbalized understanding and agreeable. Referral sent to Amy [phone] and she accepted and will contact patient in the home after discharge Thursday 5/22 of this week. Lennart Quitter will manage dressing changes in the home 2x a week in the home. Phone number of [facility] given to the patient#[phone].   Home wound vac ordered through [facility]- representative [employer] and provider sent order and insurance approved. Home VAC brought to patient's room. WOC RN- at [facility] came to room to place home vac.  Patient verbalized understanding of discharge plans.  Patient will follow up with Dr. Wynona Hedger for follow up after discharge.   CM did call [facility] # [phone] on [address] and spoke to [place] and she shared they had no availability  until June for any appointments.    Expected Discharge Plan: Home w Home Health Services Barriers to Discharge: Continued Medical Work up  Expected Discharge Plan and Services Home with [facility] wound home vac and Home Health with [facility]   Social Determinants of Health (SDOH) Interventions SDOH Screenings   Food Insecurity: No  Food Insecurity (05/14/2024)  Housing: Low Risk  (05/14/2024)  Transportation Needs: No Transportation Needs (05/14/2024)  Utilities: Not At Risk (05/14/2024)  Tobacco Use: Low Risk  (05/15/2024)    Readmission Risk Interventions     No data to display

## 2024-05-17 NOTE — Discharge Summary (Signed)
 Physician Discharge Summary  Patient ID: Erin Ross MRN: [id number] DOB/AGE: 1966-10-10 58 y.o.  Admit date: 05/14/2024 Discharge date: 05/18/2024  Admission Diagnoses: Wound Infection after surgery   Discharge Diagnoses:  Principal Problem:   Wound infection after surgery   Discharged Condition: stable  Hospital Course: Patient was admitted due to wound infection  that was unresponsive and worsening despite oral Bactrim. She was admitted on 05/14/2024. A wound culture was obtained. She was started on Rocephin  and Flagyl  IV. She underwent wound exploration and debridement on 05/15/2024. Wet to dry dressing changes were performed tid  initially. She is discharged home with negative pressure  Wound vac and home health was arranged to change the wound 2 times a week.   Consults: Wound Consult for Wound vac   Significant Diagnostic Studies: labs:  Results for orders placed or performed during the hospital encounter of 05/14/24 (from the past 72 hours)  Glucose, capillary     Status: Abnormal   Collection Time: 05/15/24 11:56 AM  Result Value Ref Range   Glucose-Capillary 103 (H) 70 - 99 mg/dL    Comment: Glucose reference range applies only to samples taken after fasting for at least 8 hours.  Glucose, capillary     Status: Abnormal   Collection Time: 05/15/24  3:52 PM  Result Value Ref Range   Glucose-Capillary 146 (H) 70 - 99 mg/dL    Comment: Glucose reference range applies only to samples taken after fasting for at least 8 hours.  Glucose, capillary     Status: Abnormal   Collection Time: 05/15/24  8:14 PM  Result Value Ref Range   Glucose-Capillary 132 (H) 70 - 99 mg/dL    Comment: Glucose reference range applies only to samples taken after fasting for at least 8 hours.  Glucose, capillary     Status: Abnormal   Collection Time: 05/16/24 12:31 AM  Result Value Ref Range   Glucose-Capillary 121 (H) 70 - 99 mg/dL    Comment: Glucose reference range applies only to  samples taken after fasting for at least 8 hours.  Glucose, capillary     Status: Abnormal   Collection Time: 05/16/24  4:06 AM  Result Value Ref Range   Glucose-Capillary 129 (H) 70 - 99 mg/dL    Comment: Glucose reference range applies only to samples taken after fasting for at least 8 hours.  CBC with Differential/Platelet     Status: Abnormal   Collection Time: 05/16/24  4:18 AM  Result Value Ref Range   WBC 11.7 (H) 4.0 - 10.5 K/uL   RBC 3.73 (L) 3.87 - 5.11 MIL/uL   Hemoglobin 10.7 (L) 12.0 - 15.0 g/dL   HCT 78.2 (L) 95.6 - 21.3 %   MCV 84.5 80.0 - 100.0 fL   MCH 28.7 26.0 - 34.0 pg   MCHC 34.0 30.0 - 36.0 g/dL   RDW 08.6 57.8 - 46.9 %   Platelets 561 (H) 150 - 400 K/uL   nRBC 0.0 0.0 - 0.2 %   Neutrophils Relative % 81 %   Neutro Abs 9.6 (H) 1.7 - 7.7 K/uL   Lymphocytes Relative 13 %   Lymphs Abs 1.5 0.7 - 4.0 K/uL   Monocytes Relative 5 %   Monocytes Absolute 0.6 0.1 - 1.0 K/uL   Eosinophils Relative 0 %   Eosinophils Absolute 0.0 0.0 - 0.5 K/uL   Basophils Relative 0 %   Basophils Absolute 0.0 0.0 - 0.1 K/uL   Immature Granulocytes 1 %   Abs  Immature Granulocytes 0.06 0.00 - 0.07 K/uL    Comment: Performed at [facility], [address], [place], [place] [address]  Basic metabolic panel with GFR     Status: Abnormal   Collection Time: 05/16/24  6:22 AM  Result Value Ref Range   Sodium 138 135 - 145 mmol/L   Potassium 4.6 3.5 - 5.1 mmol/L   Chloride 104 98 - 111 mmol/L   CO2 25 22 - 32 mmol/L   Glucose, Bld 108 (H) 70 - 99 mg/dL    Comment: Glucose reference range applies only to samples taken after fasting for at least 8 hours.   BUN 16 6 - 20 mg/dL   Creatinine, Ser 5.78 0.44 - 1.00 mg/dL   Calcium 9.6 8.9 - 46.9 mg/dL   GFR, Estimated >62 >95 mL/min    Comment: (NOTE) Calculated using the CKD-EPI Creatinine Equation (2021)    Anion gap 9 5 - 15    Comment: Performed at [facility], [address], [place], [place] [address]  Glucose,  capillary     Status: Abnormal   Collection Time: 05/16/24  7:51 AM  Result Value Ref Range   Glucose-Capillary 107 (H) 70 - 99 mg/dL    Comment: Glucose reference range applies only to samples taken after fasting for at least 8 hours.  Glucose, capillary     Status: None   Collection Time: 05/16/24 12:02 PM  Result Value Ref Range   Glucose-Capillary 94 70 - 99 mg/dL    Comment: Glucose reference range applies only to samples taken after fasting for at least 8 hours.  Glucose, capillary     Status: Abnormal   Collection Time: 05/16/24  3:57 PM  Result Value Ref Range   Glucose-Capillary 116 (H) 70 - 99 mg/dL    Comment: Glucose reference range applies only to samples taken after fasting for at least 8 hours.  Glucose, capillary     Status: None   Collection Time: 05/16/24  7:56 PM  Result Value Ref Range   Glucose-Capillary 79 70 - 99 mg/dL    Comment: Glucose reference range applies only to samples taken after fasting for at least 8 hours.  Glucose, capillary     Status: Abnormal   Collection Time: 05/17/24 12:07 AM  Result Value Ref Range   Glucose-Capillary 105 (H) 70 - 99 mg/dL    Comment: Glucose reference range applies only to samples taken after fasting for at least 8 hours.  Glucose, capillary     Status: None   Collection Time: 05/17/24  4:16 AM  Result Value Ref Range   Glucose-Capillary 86 70 - 99 mg/dL    Comment: Glucose reference range applies only to samples taken after fasting for at least 8 hours.     Treatments: antibiotics: ceftriaxone  and metronidazole  and surgery: Wound exploration and debridement   Discharge Exam: Blood pressure (!) 163/77, pulse 72, temperature 98.1 F (36.7 C), temperature source Axillary, resp. rate 19, height 5\' 7"  (1.702 m), weight 111 kg, last menstrual period 09/17/2017, SpO2 98%. General appearance: alert, cooperative, and no distress Resp: no distress  GI: soft, non-tender; bowel sounds normal; no masses,  no  organomegaly Extremities: extremities normal, atraumatic, no cyanosis or edema Incision/Wound: dressing clean dry and intact   Disposition: Discharge disposition: 01-Home or Self Care       Discharge Instructions     Call MD for:  persistant nausea and vomiting   Complete by: As directed  Call MD for:  redness, tenderness, or signs of infection (pain, swelling, redness, odor or green/yellow discharge around incision site)   Complete by: As directed    Call MD for:  severe uncontrolled pain   Complete by: As directed    Call MD for:  temperature >100.4   Complete by: As directed    Diet Carb Modified   Complete by: As directed    Discharge wound care:   Complete by: As directed    Home health arranged for wound vac change 2 times a week   Driving Restrictions   Complete by: As directed    Avoid driving for 2 weeks   Increase activity slowly   Complete by: As directed    Lifting restrictions   Complete by: As directed    Avoid lifting over 10 lbs   May walk up steps   Complete by: As directed    Sexual Activity Restrictions   Complete by: As directed    Avoid sex for 6 weeks      Allergies as of 05/17/2024       Reactions   Penicillins Other (See Comments)   Childhood allergy when pt was ~18 months old        Medication List     STOP taking these medications    diclofenac 50 MG tablet Commonly known as: CATAFLAM       TAKE these medications    acetaminophen  500 MG tablet Commonly known as: TYLENOL  Take 500 mg by mouth every 6 (six) hours as needed (pain.).   amLODipine  5 MG tablet Commonly known as: NORVASC  Take 5 mg by mouth daily.   amoxicillin -clavulanate 875-125 MG tablet Commonly known as: AUGMENTIN  Take 1 tablet by mouth every 12 (twelve) hours for 10 days.   aspirin EC 81 MG tablet Take 81 mg by mouth in the morning.   cetirizine 10 MG tablet Commonly known as: ZYRTEC Take 10 mg by mouth daily.   Fish Oil 1200 MG Caps Take 1  capsule by mouth daily.   fluticasone 50 MCG/ACT nasal spray Commonly known as: FLONASE Place 1 spray into both nostrils daily as needed for allergies or rhinitis.   ibuprofen  800 MG tablet Commonly known as: ADVIL  Take 1 tablet (800 mg total) by mouth every 8 (eight) hours as needed for moderate pain (pain score 4-6). What changed:  medication strength how much to take reasons to take this Another medication with the same name was removed. Continue taking this medication, and follow the directions you see here.   lisinopril -hydrochlorothiazide  20-12.5 MG tablet Commonly known as: ZESTORETIC  Take 1 tablet by mouth daily.   multivitamin with minerals Tabs tablet Take 1 tablet by mouth daily.   omeprazole 10 MG capsule Commonly known as: PRILOSEC Take 10 mg by mouth daily as needed.   oxyCODONE  5 MG immediate release tablet Commonly known as: Oxy IR/ROXICODONE  Take 1 tablet (5 mg total) by mouth every 6 (six) hours as needed for severe pain (pain score 7-10). What changed:  how much to take reasons to take this   Probiotic Daily Caps Take 1 capsule by mouth daily.   vitamin D3 25 MCG tablet Commonly known as: CHOLECALCIFEROL Take 1,000 Units by mouth every other day. In the morning               Discharge Care Instructions  (From admission, onward)           Start     Ordered   05/17/24  0000  Discharge wound care:       Comments: Home health arranged for wound vac change 2 times a week   05/17/24 1705            Follow-up Information     Meldon Sport, DO. Schedule an appointment as soon as possible for a visit in 1 week(s).   Specialty: Obstetrics and Gynecology Contact information: [address] [place] [address] [place] [address] [phone]                 Signed: Arlee Lace 05/18/2024, 11:36 AM

## 2024-05-17 NOTE — Consult Note (Addendum)
 WOC Nurse Consult Note: Reason for Consult: Surgical incision to lower abdomen. S/P I & D hematoma, debridement of abscess.   Application of home NPWT (VAC) device.  HH to begin this week.  Wound type: surgical, infectious   Pressure Injury POA: NA Measurement: 15 cm x 3 cm x 4 cm  Wound bed: Beefy red, adipose tissue  Drainage (amount, consistency, odor) moderate serosanguinous  no odor  Periwound: abdominal pannus.  Incision is 2 cm from creasing.  May cause challenge to keep seal.    Barrier ring added to periwound circumferentially to promote seal.  Dressing procedure/placement/frequency: Cleanse with NS and pat dry.  1piece black foam filled into wound bed gently.  Covered with drape and TRAC pad.  Seal achieved at 125 MMhg  Change Monday and Thursday.  Assembled home unit.  Discussed battery charging, leaving connected to outlet when possible.  Tripping/fall hazard of VAC cords and plug in and be aware of safety when ambulating.  Discussed nutrition for wound healing.  Increased protein, balanced diet, adequate water .  Increasing activity slowly.  Patient verbalizes understanding.  If VAC loses seal, will need to remove dressing and apply NS moist gauze dressing.  Bedside RN will teach dressing change process and make sure patient has supplies for home and number to contact [facility] agency if needed.  Patient verbalizes understanding and is anxious to be discharged home   Will not follow at this time.  Please re-consult if needed.  Branda Cain MSN, RN, FNP-BC CWON Wound, Ostomy, Continence Nurse Outpatient [facility] [phone] Pager [phone]

## 2024-05-18 LAB — AEROBIC/ANAEROBIC CULTURE W GRAM STAIN (SURGICAL/DEEP WOUND)

## 2024-05-19 ENCOUNTER — Telehealth: Payer: Self-pay | Admitting: *Deleted

## 2024-05-21 ENCOUNTER — Inpatient Hospital Stay (HOSPITAL_COMMUNITY)
Admission: AD | Admit: 2024-05-21 | Discharge: 2024-05-21 | Disposition: A | Discharge status: Home / Self Care | Attending: Family Medicine | Admitting: Family Medicine

## 2024-05-21 DIAGNOSIS — T8149XA Infection following a procedure, other surgical site, initial encounter: Secondary | ICD-10-CM

## 2024-05-21 DIAGNOSIS — Z5189 Encounter for other specified aftercare: Secondary | ICD-10-CM | POA: Diagnosis not present

## 2024-05-21 DIAGNOSIS — T8141XA Infection following a procedure, superficial incisional surgical site, initial encounter: Secondary | ICD-10-CM | POA: Diagnosis not present

## 2024-05-21 DIAGNOSIS — L089 Local infection of the skin and subcutaneous tissue, unspecified: Secondary | ICD-10-CM | POA: Diagnosis present

## 2024-05-21 NOTE — MAU Provider Note (Signed)
 [facility] Attending MAU Note  Chief Complaint: Dressing Change    Event Date/Time   First Provider Initiated Contact with Patient 05/21/24 1903      SUBJECTIVE Erin Ross is a 58 y.o. No obstetric history on file. She is s/p hysterectomy on 4/23. Returned with wound infection and clean out last week. Sent home with wound vac in place on 5/19-25. Was supposed to have home health but they could not come. She came in for wound care.  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   Past Surgical History:  Procedure Laterality Date   COLONOSCOPY  2019   CYSTOSCOPY N/A 04/21/2024   Procedure: CYSTOSCOPY;  Surgeon: Meldon Sport, DO;  Location: [facility] OR;  Service: Gynecology;  Laterality: N/A;   DILATATION & CURETTAGE/HYSTEROSCOPY WITH MYOSURE N/A 10/16/2022   Procedure: DILATATION & CURETTAGE/HYSTEROSCOPY WITH MYOSURE;  Surgeon: Meldon Sport, DO;  Location: [facility] OR;  Service: Gynecology;  Laterality: N/A;   HYSTERECTOMY ABDOMINAL WITH SALPINGECTOMY Bilateral 04/21/2024   Procedure: HYSTERECTOMY, TOTAL, ABDOMINAL, WITH SALPINGO-OOPHORECTOMY;  Surgeon: Meldon Sport, DO;  Location: [facility] OR;  Service: Gynecology;  Laterality: Bilateral;   IRRIGATION AND DEBRIDEMENT HEMATOMA N/A 05/15/2024   Procedure: IRRIGATION AND DEBRIDEMENT HEMATOMA;  Surgeon: Arlee Lace, MD;  Location: [facility] OR;  Service: Gynecology;  Laterality: N/A;   LEG SURGERY Right 1970   63 months old- had fluid removed   WOUND EXPLORATION N/A 05/15/2024   Procedure: WOUND EXPLORATION;  Surgeon: Arlee Lace, MD;  Location: [facility] OR;  Service: Gynecology;   Laterality: N/A;   Social History   Socioeconomic History   Marital status: Single    Spouse name: Not on file   Number of children: 0   Years of education: 14   Highest education level: Not on file  Occupational History   Not on file  Tobacco Use   Smoking status: Never   Smokeless tobacco: Never  Vaping Use   Vaping status: Never Used  Substance and Sexual Activity   Alcohol use: No   Drug use: Never   Sexual activity: Yes    Birth control/protection: Post-menopausal  Other Topics Concern   Not on file  Social History Narrative   Right handed   Caffeine use: Diet Ginger ale   Social Drivers of Health   Financial Resource Strain: Not on file  Food Insecurity: No Food Insecurity (05/14/2024)   Hunger Vital Sign    Worried About Running Out of Food in the Last Year: Never true    Ran Out of Food in the Last Year: Never true  Transportation Needs: No Transportation Needs (05/14/2024)   PRAPARE - Administrator, Civil Service (Medical): No    Lack of Transportation (Non-Medical): No  Physical Activity: Not on file  Stress: Not on file  Social Connections: Not on file  Intimate Partner Violence: Not At Risk (05/14/2024)   Humiliation, Afraid, Rape, and Kick questionnaire    Fear of Current or Ex-Partner: No    Emotionally Abused: No    Physically Abused: No    Sexually Abused: No  No current facility-administered medications on file prior to encounter.   Current Outpatient Medications on File Prior to Encounter  Medication Sig Dispense Refill   amLODipine  (NORVASC ) 5 MG tablet Take 5 mg by mouth daily.     amoxicillin -clavulanate (AUGMENTIN ) 875-125 MG tablet Take 1 tablet by mouth every 12 (twelve) hours for 10 days. 20 tablet 0   aspirin EC 81 MG tablet Take 81 mg by mouth in the morning.     ibuprofen  (ADVIL ) 800 MG tablet Take 1 tablet (800 mg total) by mouth every 8 (eight) hours as needed for moderate pain (pain score 4-6). 30 tablet 0    lisinopril -hydrochlorothiazide  (PRINZIDE ,ZESTORETIC ) 20-12.5 MG per tablet Take 1 tablet by mouth daily.     Multiple Vitamin (MULTIVITAMIN WITH MINERALS) TABS tablet Take 1 tablet by mouth daily.     Omega-3 Fatty Acids (FISH OIL) 1200 MG CAPS Take 1 capsule by mouth daily.     vitamin D3 (CHOLECALCIFEROL) 25 MCG tablet Take 1,000 Units by mouth every other day. In the morning     acetaminophen  (TYLENOL ) 500 MG tablet Take 500 mg by mouth every 6 (six) hours as needed (pain.).     cetirizine (ZYRTEC) 10 MG tablet Take 10 mg by mouth daily.     fluticasone (FLONASE) 50 MCG/ACT nasal spray Place 1 spray into both nostrils daily as needed for allergies or rhinitis.     omeprazole (PRILOSEC) 10 MG capsule Take 10 mg by mouth daily as needed.     oxyCODONE  (OXY IR/ROXICODONE ) 5 MG immediate release tablet Take 1 tablet (5 mg total) by mouth every 6 (six) hours as needed for severe pain (pain score 7-10). 15 tablet 0   Probiotic Product (PROBIOTIC DAILY) CAPS Take 1 capsule by mouth daily.     Allergies  Allergen Reactions   Penicillins Other (See Comments)    Childhood allergy when pt was ~18 months old    ROS: Pertinent items in HPI  OBJECTIVE BP (!) 149/76 (BP Location: Left Arm)   Pulse 74   Temp 98.3 F (36.8 C) (Oral)   Resp 20   LMP 09/17/2017 Comment: no chance of pregnancy per patient CONSTITUTIONAL: Well-developed, well-nourished female in no acute distress.  HENT:  Normocephalic, atraumatic, External right and left ear normal. Oropharynx is clear and moist EYES: Conjunctivae and EOM are normal.  No scleral icterus.  NECK: Normal range of motion, supple SKIN: no rash WOUND: bed is pink and bleeding showing good blood flow to the area.  NEUROLGIC: Alert and oriented to person, place, and time. Normal reflexes, muscle tone coordination. No cranial nerve deficit noted. PSYCHIATRIC: Normal mood and affect. Normal behavior. Normal judgment and thought content. CARDIOVASCULAR: Normal  heart rate noted RESPIRATORY: Effort and breath sounds normal, no problems with respiration noted. ABDOMEN: Soft, normal bowel sounds, no distention noted.  No tenderness, rebound or guarding.  MUSCULOSKELETAL: Normal range of motion. No tenderness.  No cyanosis, clubbing, or edema.  2+ distal pulses.  LAB RESULTS No results found for this or any previous visit (from the past 48 hours).  IMAGING CT ABDOMEN PELVIS W CONTRAST Result Date: 05/14/2024 CLINICAL DATA:  Abdominal abscess. Evaluate for fascial dehiscence/deep wound infection. EXAM: CT ABDOMEN AND PELVIS WITH CONTRAST TECHNIQUE: Multidetector CT imaging of the abdomen and pelvis was performed using the standard protocol following bolus administration of intravenous contrast. RADIATION DOSE REDUCTION: This exam was performed according to the departmental dose-optimization program which includes automated exposure control, adjustment of the mA and/or kV  according to patient size and/or use of iterative reconstruction technique. CONTRAST:  50mL OMNIPAQUE  IOHEXOL  350 MG/ML SOLN COMPARISON:  None Available. FINDINGS: Lower chest: No acute abnormality. Hepatobiliary: No focal liver abnormality is seen. No gallstones, gallbladder wall thickening, or biliary dilatation. Pancreas: Unremarkable. No pancreatic ductal dilatation or surrounding inflammatory changes. Spleen: Normal in size without focal abnormality. Adrenals/Urinary Tract: Adrenal glands are unremarkable. Kidneys are normal, without renal calculi, focal lesion, or hydronephrosis. Bladder is unremarkable. Stomach/Bowel: Stomach is within normal limits. Appendix appears normal. No evidence of bowel wall thickening, distention, or inflammatory changes. There is diffuse colonic diverticulosis. Vascular/Lymphatic: No significant vascular findings are present. No enlarged abdominal or pelvic lymph nodes. Reproductive: Status post hysterectomy. There is a lobulated soft tissue density in the left  adnexa measuring 2.9 by 1.9 by 3.5 cm. No right adnexal mass. Other: There is trace free fluid in the pelvis. Lower transverse abdominal wall wound is present. There is a small amount of air and stranding. There is a very thin fluid collection in the deep soft tissues of this region measuring 6.5 by 0.9 by 1.7 cm. This collection does not contain air. Musculoskeletal: No acute or significant osseous findings. IMPRESSION: 1. Lower transverse abdominal wall wound with a small amount of air and stranding. There is a very thin fluid collection in the deep soft tissues of this region measuring 6.5 x 0.9 x 1.7 cm, indeterminate. 2. Trace free fluid in the pelvis. 3. Colonic diverticulosis. 4. Lobulated soft tissue density in the left adnexa measuring 3.5 cm. Findings may relate dated to residual ovarian tissue in the appropriate clinical setting. Please correlate with surgical history. Consider further evaluation with pelvic ultrasound. Electronically Signed   By: Tyron Gallon M.D.   On: 05/14/2024 19:54    MAU COURSE Previous wound vac turned off and removed her foam. Wound cleaned with saline on a sponge. New foam cut to size and placed in the wound and wound vac re-hooked up with good seal.  ASSESSMENT 1. Encounter for wound care   2. Wound infection after surgery     PLAN Discharge home  Follow-up Information     Gynecology, [facility].   Specialty: Obstetrics and Gynecology Contact information: [address] [place] [address] [phone]                Allergies as of 05/21/2024       Reactions   Penicillins Other (See Comments)   Childhood allergy when pt was ~18 months old        Medication List     TAKE these medications    acetaminophen  500 MG tablet Commonly known as: TYLENOL  Take 500 mg by mouth every 6 (six) hours as needed (pain.).   amLODipine  5 MG tablet Commonly known as: NORVASC  Take 5 mg by mouth daily.    amoxicillin -clavulanate 875-125 MG tablet Commonly known as: AUGMENTIN  Take 1 tablet by mouth every 12 (twelve) hours for 10 days.   aspirin EC 81 MG tablet Take 81 mg by mouth in the morning.   cetirizine 10 MG tablet Commonly known as: ZYRTEC Take 10 mg by mouth daily.   Fish Oil 1200 MG Caps Take 1 capsule by mouth daily.   fluticasone 50 MCG/ACT nasal spray Commonly known as: FLONASE Place 1 spray into both nostrils daily as needed for allergies or rhinitis.   ibuprofen  800 MG tablet Commonly known as: ADVIL  Take 1 tablet (800 mg total) by mouth every  8 (eight) hours as needed for moderate pain (pain score 4-6).   lisinopril -hydrochlorothiazide  20-12.5 MG tablet Commonly known as: ZESTORETIC  Take 1 tablet by mouth daily.   multivitamin with minerals Tabs tablet Take 1 tablet by mouth daily.   omeprazole 10 MG capsule Commonly known as: PRILOSEC Take 10 mg by mouth daily as needed.   oxyCODONE  5 MG immediate release tablet Commonly known as: Oxy IR/ROXICODONE  Take 1 tablet (5 mg total) by mouth every 6 (six) hours as needed for severe pain (pain score 7-10).   Probiotic Daily Caps Take 1 capsule by mouth daily.   vitamin D3 25 MCG tablet Commonly known as: CHOLECALCIFEROL Take 1,000 Units by mouth every other day. In the morning        Evaluation does not show pathology that would require ongoing emergent intervention or inpatient treatment. Patient is hemodynamically stable and mentating appropriately. Discussed findings and plan with patient, who agrees with care plan. All questions answered. Return precautions discussed and outpatient follow up recommendations given.  Granville Layer, MD 05/21/2024 7:08 PM

## 2024-05-21 NOTE — MAU Note (Signed)
 Erin Ross is a 58 y.o. in MAU for wound/incision care.  Pt had hysterectomy on 5/17.  Last dressing change was 5/19. Was to have home care, was told they could not accommodate her. The wound clinic can not see her until June. Pt denies problems with elimination. No bleeding or pain.   Onset of complaint: ongoing Pain score: none Vitals:   05/21/24 1822  BP: (!) 149/76  Pulse: 74  Resp: 20  Temp: 98.3 F (36.8 C)      Lab orders placed from triage:

## 2024-05-25 NOTE — Care Management (Addendum)
 After discharge note: 05/26/23 10:45 CM spoke to [facility] with [employer] # [phone] and she plans to go to physician's  office today at 1:45 pm to meet patient and Dr. Elisha Guillaume for patient's appointment today. Sherrlyn Dolores plans to assist with wound vac dressing change and education for dressing changes moving forward to their office staff.  CM spoke to [employer] clinical supervisor at [employer] # [phone] and she confirmed plan also and shared that their office has called patient and requested that patient bring in wound vac supplies to appointment.   Viva Grise RNC-MNN, BSN Transitions of [facility]

## 2024-06-04 ENCOUNTER — Encounter (HOSPITAL_BASED_OUTPATIENT_CLINIC_OR_DEPARTMENT_OTHER): Attending: Internal Medicine | Admitting: Internal Medicine

## 2024-06-04 DIAGNOSIS — Z90710 Acquired absence of both cervix and uterus: Secondary | ICD-10-CM | POA: Diagnosis not present

## 2024-06-04 DIAGNOSIS — T8131XD Disruption of external operation (surgical) wound, not elsewhere classified, subsequent encounter: Secondary | ICD-10-CM | POA: Diagnosis present

## 2024-06-04 DIAGNOSIS — S31109D Unspecified open wound of abdominal wall, unspecified quadrant without penetration into peritoneal cavity, subsequent encounter: Secondary | ICD-10-CM | POA: Insufficient documentation

## 2024-06-07 ENCOUNTER — Other Ambulatory Visit (HOSPITAL_COMMUNITY): Payer: Self-pay

## 2024-06-08 ENCOUNTER — Encounter (HOSPITAL_BASED_OUTPATIENT_CLINIC_OR_DEPARTMENT_OTHER): Admitting: Internal Medicine

## 2024-06-08 DIAGNOSIS — S31109D Unspecified open wound of abdominal wall, unspecified quadrant without penetration into peritoneal cavity, subsequent encounter: Secondary | ICD-10-CM | POA: Diagnosis not present

## 2024-06-08 DIAGNOSIS — T8131XD Disruption of external operation (surgical) wound, not elsewhere classified, subsequent encounter: Secondary | ICD-10-CM | POA: Diagnosis not present

## 2024-06-15 ENCOUNTER — Encounter (HOSPITAL_BASED_OUTPATIENT_CLINIC_OR_DEPARTMENT_OTHER): Admitting: Internal Medicine

## 2024-06-15 DIAGNOSIS — S31109D Unspecified open wound of abdominal wall, unspecified quadrant without penetration into peritoneal cavity, subsequent encounter: Secondary | ICD-10-CM

## 2024-06-15 DIAGNOSIS — T8131XD Disruption of external operation (surgical) wound, not elsewhere classified, subsequent encounter: Secondary | ICD-10-CM | POA: Diagnosis not present

## 2024-06-22 ENCOUNTER — Encounter (HOSPITAL_BASED_OUTPATIENT_CLINIC_OR_DEPARTMENT_OTHER): Admitting: Internal Medicine

## 2024-06-22 DIAGNOSIS — T8131XD Disruption of external operation (surgical) wound, not elsewhere classified, subsequent encounter: Secondary | ICD-10-CM

## 2024-06-22 DIAGNOSIS — S31109D Unspecified open wound of abdominal wall, unspecified quadrant without penetration into peritoneal cavity, subsequent encounter: Secondary | ICD-10-CM | POA: Diagnosis not present

## 2024-06-22 DIAGNOSIS — Z90710 Acquired absence of both cervix and uterus: Secondary | ICD-10-CM | POA: Diagnosis not present

## 2024-06-29 ENCOUNTER — Encounter (HOSPITAL_BASED_OUTPATIENT_CLINIC_OR_DEPARTMENT_OTHER): Attending: Internal Medicine | Admitting: Internal Medicine

## 2024-06-29 DIAGNOSIS — T8131XD Disruption of external operation (surgical) wound, not elsewhere classified, subsequent encounter: Secondary | ICD-10-CM | POA: Insufficient documentation

## 2024-06-29 DIAGNOSIS — Z90710 Acquired absence of both cervix and uterus: Secondary | ICD-10-CM | POA: Diagnosis not present

## 2024-06-29 DIAGNOSIS — S31109D Unspecified open wound of abdominal wall, unspecified quadrant without penetration into peritoneal cavity, subsequent encounter: Secondary | ICD-10-CM | POA: Diagnosis not present

## 2024-07-06 ENCOUNTER — Encounter (HOSPITAL_BASED_OUTPATIENT_CLINIC_OR_DEPARTMENT_OTHER): Admitting: Internal Medicine

## 2024-07-06 DIAGNOSIS — Z90710 Acquired absence of both cervix and uterus: Secondary | ICD-10-CM | POA: Diagnosis not present

## 2024-07-06 DIAGNOSIS — S31109D Unspecified open wound of abdominal wall, unspecified quadrant without penetration into peritoneal cavity, subsequent encounter: Secondary | ICD-10-CM

## 2024-07-06 DIAGNOSIS — T8131XD Disruption of external operation (surgical) wound, not elsewhere classified, subsequent encounter: Secondary | ICD-10-CM

## 2024-07-13 ENCOUNTER — Encounter (HOSPITAL_BASED_OUTPATIENT_CLINIC_OR_DEPARTMENT_OTHER): Admitting: Internal Medicine

## 2024-07-13 DIAGNOSIS — S31109D Unspecified open wound of abdominal wall, unspecified quadrant without penetration into peritoneal cavity, subsequent encounter: Secondary | ICD-10-CM

## 2024-07-13 DIAGNOSIS — Z90710 Acquired absence of both cervix and uterus: Secondary | ICD-10-CM | POA: Diagnosis not present

## 2024-07-13 DIAGNOSIS — T8131XD Disruption of external operation (surgical) wound, not elsewhere classified, subsequent encounter: Secondary | ICD-10-CM | POA: Diagnosis not present

## 2024-07-20 ENCOUNTER — Encounter (HOSPITAL_BASED_OUTPATIENT_CLINIC_OR_DEPARTMENT_OTHER): Admitting: Internal Medicine

## 2024-07-20 DIAGNOSIS — Z90710 Acquired absence of both cervix and uterus: Secondary | ICD-10-CM

## 2024-07-20 DIAGNOSIS — T8131XD Disruption of external operation (surgical) wound, not elsewhere classified, subsequent encounter: Secondary | ICD-10-CM

## 2024-07-20 DIAGNOSIS — S31109D Unspecified open wound of abdominal wall, unspecified quadrant without penetration into peritoneal cavity, subsequent encounter: Secondary | ICD-10-CM | POA: Diagnosis not present

## 2024-07-27 ENCOUNTER — Encounter (HOSPITAL_BASED_OUTPATIENT_CLINIC_OR_DEPARTMENT_OTHER): Admitting: Internal Medicine

## 2024-07-27 DIAGNOSIS — T8131XD Disruption of external operation (surgical) wound, not elsewhere classified, subsequent encounter: Secondary | ICD-10-CM | POA: Diagnosis not present

## 2024-08-10 ENCOUNTER — Other Ambulatory Visit: Payer: Self-pay | Admitting: Physician Assistant

## 2024-08-10 DIAGNOSIS — Z1231 Encounter for screening mammogram for malignant neoplasm of breast: Secondary | ICD-10-CM

## 2024-08-24 ENCOUNTER — Other Ambulatory Visit (HOSPITAL_COMMUNITY): Payer: Self-pay

## 2024-12-24 ENCOUNTER — Ambulatory Visit

## 2024-12-24 ENCOUNTER — Ambulatory Visit
Admission: RE | Admit: 2024-12-24 | Discharge: 2024-12-24 | Disposition: A | Discharge status: Home / Self Care | Source: Ambulatory Visit | Attending: Physician Assistant | Admitting: Physician Assistant

## 2024-12-24 DIAGNOSIS — Z1231 Encounter for screening mammogram for malignant neoplasm of breast: Secondary | ICD-10-CM
